# Patient Record
Sex: Male | Born: 1965
Health system: Southern US, Community
[De-identification: ages and names within clinical notes are randomized; demographics above are authoritative.]

## PROBLEM LIST (undated history)

## (undated) DIAGNOSIS — D333 Benign neoplasm of cranial nerves: Secondary | ICD-10-CM

## (undated) DIAGNOSIS — T7840XA Allergy, unspecified, initial encounter: Secondary | ICD-10-CM

## (undated) DIAGNOSIS — Z9889 Other specified postprocedural states: Secondary | ICD-10-CM

## (undated) DIAGNOSIS — I10 Essential (primary) hypertension: Secondary | ICD-10-CM

## (undated) DIAGNOSIS — H9190 Unspecified hearing loss, unspecified ear: Secondary | ICD-10-CM

## (undated) DIAGNOSIS — R112 Nausea with vomiting, unspecified: Secondary | ICD-10-CM

## (undated) DIAGNOSIS — E785 Hyperlipidemia, unspecified: Secondary | ICD-10-CM

## (undated) HISTORY — DX: Hyperlipidemia, unspecified: E78.5

## (undated) HISTORY — DX: Allergy, unspecified, initial encounter: T78.40XA

## (undated) HISTORY — DX: Benign neoplasm of cranial nerves: D33.3

## (undated) HISTORY — PX: COLONOSCOPY: SHX174

## (undated) HISTORY — PX: WISDOM TOOTH EXTRACTION: SHX21

## (undated) HISTORY — DX: Essential (primary) hypertension: I10

## (undated) HISTORY — PX: KNEE SURGERY: SHX244

---

## 2006-08-24 ENCOUNTER — Encounter (INDEPENDENT_AMBULATORY_CARE_PROVIDER_SITE_OTHER): Payer: Self-pay | Admitting: Family Medicine

## 2006-08-24 ENCOUNTER — Ambulatory Visit: Payer: Self-pay | Admitting: Family Medicine

## 2006-08-24 ENCOUNTER — Encounter: Payer: Self-pay | Admitting: Family Medicine

## 2006-08-24 DIAGNOSIS — E785 Hyperlipidemia, unspecified: Secondary | ICD-10-CM | POA: Insufficient documentation

## 2006-08-24 DIAGNOSIS — I10 Essential (primary) hypertension: Secondary | ICD-10-CM | POA: Insufficient documentation

## 2006-08-24 LAB — CONVERTED CEMR LAB
AST: 22 units/L (ref 0–37)
BUN: 7 mg/dL (ref 6–23)
CO2: 32 meq/L (ref 19–32)
Calcium: 9.4 mg/dL (ref 8.4–10.5)
GFR calc Af Amer: 137 mL/min
GFR calc non Af Amer: 113 mL/min
LDL Cholesterol: 72 mg/dL (ref 0–99)
Potassium: 4.4 meq/L (ref 3.5–5.1)
Total CHOL/HDL Ratio: 3.2
Triglycerides: 116 mg/dL (ref 0–149)

## 2006-09-07 ENCOUNTER — Ambulatory Visit: Payer: Self-pay | Admitting: Family Medicine

## 2006-09-07 LAB — CONVERTED CEMR LAB
CO2: 34 meq/L — ABNORMAL HIGH (ref 19–32)
Creatinine, Ser: 1 mg/dL (ref 0.4–1.5)
GFR calc Af Amer: 106 mL/min
Potassium: 3.4 meq/L — ABNORMAL LOW (ref 3.5–5.1)
Sodium: 143 meq/L (ref 135–145)

## 2006-09-20 ENCOUNTER — Ambulatory Visit: Payer: Self-pay | Admitting: Family Medicine

## 2006-09-20 LAB — CONVERTED CEMR LAB: Potassium: 3.3 meq/L — ABNORMAL LOW (ref 3.5–5.1)

## 2006-09-26 ENCOUNTER — Ambulatory Visit: Payer: Self-pay | Admitting: Family Medicine

## 2006-10-12 ENCOUNTER — Ambulatory Visit: Payer: Self-pay | Admitting: Family Medicine

## 2006-10-17 ENCOUNTER — Telehealth (INDEPENDENT_AMBULATORY_CARE_PROVIDER_SITE_OTHER): Payer: Self-pay | Admitting: *Deleted

## 2008-03-25 ENCOUNTER — Ambulatory Visit: Payer: Self-pay | Admitting: Family Medicine

## 2008-03-25 DIAGNOSIS — D179 Benign lipomatous neoplasm, unspecified: Secondary | ICD-10-CM | POA: Insufficient documentation

## 2008-03-28 ENCOUNTER — Encounter (INDEPENDENT_AMBULATORY_CARE_PROVIDER_SITE_OTHER): Payer: Self-pay | Admitting: *Deleted

## 2008-03-28 LAB — CONVERTED CEMR LAB
ALT: 33 units/L (ref 0–53)
AST: 27 units/L (ref 0–37)
Albumin: 4.3 g/dL (ref 3.5–5.2)
Alkaline Phosphatase: 76 units/L (ref 39–117)
BUN: 15 mg/dL (ref 6–23)
Bilirubin, Direct: 0.1 mg/dL (ref 0.0–0.3)
CO2: 31 meq/L (ref 19–32)
Calcium: 9.3 mg/dL (ref 8.4–10.5)
Chloride: 105 meq/L (ref 96–112)
Creatinine, Ser: 1.1 mg/dL (ref 0.4–1.5)
GFR calc Af Amer: 94 mL/min
GFR calc non Af Amer: 78 mL/min
Glucose, Bld: 100 mg/dL — ABNORMAL HIGH (ref 70–99)
Potassium: 3.8 meq/L (ref 3.5–5.1)
Sodium: 142 meq/L (ref 135–145)
Total Bilirubin: 0.8 mg/dL (ref 0.3–1.2)
Total Protein: 7.4 g/dL (ref 6.0–8.3)

## 2008-04-18 ENCOUNTER — Ambulatory Visit: Payer: Self-pay | Admitting: Family Medicine

## 2008-04-22 ENCOUNTER — Encounter (INDEPENDENT_AMBULATORY_CARE_PROVIDER_SITE_OTHER): Payer: Self-pay | Admitting: *Deleted

## 2008-04-22 LAB — CONVERTED CEMR LAB
BUN: 13 mg/dL (ref 6–23)
CO2: 29 meq/L (ref 19–32)
Chloride: 103 meq/L (ref 96–112)
Cholesterol: 223 mg/dL (ref 0–200)
Creatinine, Ser: 1 mg/dL (ref 0.4–1.5)
Direct LDL: 140.3 mg/dL
Glucose, Bld: 88 mg/dL (ref 70–99)

## 2008-06-13 ENCOUNTER — Ambulatory Visit: Payer: Self-pay | Admitting: Family Medicine

## 2008-06-18 ENCOUNTER — Encounter (INDEPENDENT_AMBULATORY_CARE_PROVIDER_SITE_OTHER): Payer: Self-pay | Admitting: *Deleted

## 2008-06-18 LAB — CONVERTED CEMR LAB
BUN: 16 mg/dL (ref 6–23)
CO2: 30 meq/L (ref 19–32)
Chloride: 105 meq/L (ref 96–112)
Creatinine, Ser: 1 mg/dL (ref 0.4–1.5)
Glucose, Bld: 96 mg/dL (ref 70–99)

## 2008-07-18 ENCOUNTER — Telehealth (INDEPENDENT_AMBULATORY_CARE_PROVIDER_SITE_OTHER): Payer: Self-pay | Admitting: *Deleted

## 2008-08-07 ENCOUNTER — Ambulatory Visit: Payer: Self-pay | Admitting: Family Medicine

## 2008-08-07 DIAGNOSIS — L218 Other seborrheic dermatitis: Secondary | ICD-10-CM | POA: Insufficient documentation

## 2009-05-22 ENCOUNTER — Ambulatory Visit: Payer: Self-pay | Admitting: Family

## 2009-05-22 ENCOUNTER — Telehealth (INDEPENDENT_AMBULATORY_CARE_PROVIDER_SITE_OTHER): Payer: Self-pay | Admitting: *Deleted

## 2009-05-22 LAB — CONVERTED CEMR LAB
ALT: 40 units/L (ref 0–53)
AST: 27 units/L (ref 0–37)
Alkaline Phosphatase: 57 units/L (ref 39–117)
Basophils Absolute: 0 10*3/uL (ref 0.0–0.1)
Bilirubin, Direct: 0.1 mg/dL (ref 0.0–0.3)
CO2: 30 meq/L (ref 19–32)
Calcium: 9.8 mg/dL (ref 8.4–10.5)
Eosinophils Absolute: 0.2 10*3/uL (ref 0.0–0.7)
Glucose, Bld: 94 mg/dL (ref 70–99)
HDL: 39.8 mg/dL (ref >39.00)
Hemoglobin: 15.4 g/dL (ref 13.0–17.0)
Lymphocytes Relative: 34.7 % (ref 12.0–46.0)
Lymphs Abs: 2.2 10*3/uL (ref 0.7–4.0)
MCHC: 33.3 g/dL (ref 30.0–36.0)
Neutro Abs: 3.4 10*3/uL (ref 1.4–7.7)
RDW: 12 % (ref 11.5–14.6)
Sodium: 138 meq/L (ref 135–145)
Total Protein: 7.7 g/dL (ref 6.0–8.3)

## 2009-06-03 ENCOUNTER — Ambulatory Visit: Payer: Self-pay | Admitting: Family

## 2009-06-20 ENCOUNTER — Telehealth (INDEPENDENT_AMBULATORY_CARE_PROVIDER_SITE_OTHER): Payer: Self-pay | Admitting: *Deleted

## 2009-08-26 ENCOUNTER — Ambulatory Visit: Payer: Self-pay | Admitting: Family

## 2009-08-26 LAB — CONVERTED CEMR LAB
HDL: 44 mg/dL (ref >39.00)
LDL Cholesterol: 128 mg/dL — ABNORMAL HIGH (ref 0–99)
Total CHOL/HDL Ratio: 5
VLDL: 25.6 mg/dL (ref 0.0–40.0)

## 2009-08-27 ENCOUNTER — Encounter: Payer: Self-pay | Admitting: Family

## 2009-10-03 ENCOUNTER — Telehealth (INDEPENDENT_AMBULATORY_CARE_PROVIDER_SITE_OTHER): Payer: Self-pay | Admitting: *Deleted

## 2009-10-07 ENCOUNTER — Telehealth (INDEPENDENT_AMBULATORY_CARE_PROVIDER_SITE_OTHER): Payer: Self-pay | Admitting: *Deleted

## 2009-11-06 ENCOUNTER — Telehealth (INDEPENDENT_AMBULATORY_CARE_PROVIDER_SITE_OTHER): Payer: Self-pay | Admitting: *Deleted

## 2009-11-10 ENCOUNTER — Telehealth (INDEPENDENT_AMBULATORY_CARE_PROVIDER_SITE_OTHER): Payer: Self-pay | Admitting: *Deleted

## 2009-12-15 ENCOUNTER — Telehealth (INDEPENDENT_AMBULATORY_CARE_PROVIDER_SITE_OTHER): Payer: Self-pay | Admitting: *Deleted

## 2010-01-22 ENCOUNTER — Telehealth (INDEPENDENT_AMBULATORY_CARE_PROVIDER_SITE_OTHER): Payer: Self-pay | Admitting: *Deleted

## 2010-02-17 ENCOUNTER — Ambulatory Visit: Payer: Self-pay | Admitting: Family Medicine

## 2010-02-18 LAB — CONVERTED CEMR LAB
ALT: 30 units/L (ref 0–53)
AST: 26 units/L (ref 0–37)
Calcium: 9.7 mg/dL (ref 8.4–10.5)
Direct LDL: 158.1 mg/dL
GFR calc non Af Amer: 95.94 mL/min (ref >60)
HDL: 39.3 mg/dL (ref >39.00)
Sodium: 137 meq/L (ref 135–145)
Total Protein: 7 g/dL (ref 6.0–8.3)
Triglycerides: 109 mg/dL (ref 0.0–149.0)

## 2010-04-16 ENCOUNTER — Ambulatory Visit: Payer: Self-pay | Admitting: Family Medicine

## 2010-04-17 LAB — CONVERTED CEMR LAB
ALT: 38 units/L (ref 0–53)
Albumin: 4.6 g/dL (ref 3.5–5.2)
Total Bilirubin: 0.8 mg/dL (ref 0.3–1.2)

## 2010-05-14 ENCOUNTER — Telehealth (INDEPENDENT_AMBULATORY_CARE_PROVIDER_SITE_OTHER): Payer: Self-pay | Admitting: *Deleted

## 2010-06-30 NOTE — Assessment & Plan Note (Signed)
Summary: FOLLOWUP VISIT, WILL BE FASTING --OK TO USE SLOT PER CHEMIRA/...   Vital Signs:  Patient profile:   45 year old male Weight:      228 pounds (103.64 kg) Temp:     97.1 degrees F oral BP sitting:   144 / 100  (left arm) Cuff size:   large  Vitals Entered By: Lucious Groves CMA (February 17, 2010 8:07 AM) CC: F/U and med refill/fasting./kb Is Patient Diabetic? No Pain Assessment Patient in pain? no        History of Present Illness: 45 yo man here today for BP.  BP not well controlled today.  not salting foods.  denies increased stress.  exercising regularly- walking.  no CP, SOB, HAs, visual changes, edema, N/V.  + family hx.  hyperlipidemia- controlling levels w/ diet and exercise.  due for labs today.  Problems Prior to Update: 1)  Preventive Health Care  (ICD-V70.0) 2)  Dandruff  (ICD-690.18) 3)  Lipoma  (ICD-214.9) 4)  Family History of Colon Ca 1st Degree Relative <60  (ICD-V16.0) 5)  Hyperlipidemia  (ICD-272.4) 6)  Hypertension  (ICD-401.9)  Current Medications (verified): 1)  Lisinopril-Hydrochlorothiazide 20-12.5 Mg Tabs (Lisinopril-Hydrochlorothiazide) .Marland Kitchen.. 1 Tablet By Mouth Daily  Allergies (verified): No Known Drug Allergies  Past History:  Past Medical History: Last updated: 08/24/2006 Hypertension Hyperlipidemia  Social History: Last updated: 05/22/2009 Occupation:Electronics Married Never Smoked Alcohol use-yes several drinks a month Drug use-no  Review of Systems      See HPI  Physical Exam  General:  Well-developed,well-nourished,in no acute distress; alert,appropriate and cooperative throughout examination Head:  Normocephalic and atraumatic without obvious abnormalities. No apparent alopecia or balding. Neck:  No deformities, masses, or tenderness noted. Lungs:  Normal respiratory effort, chest expands symmetrically. Lungs are clear to auscultation, no crackles or wheezes. Heart:  Normal rate and regular rhythm. S1 and S2 normal  without gallop, murmur, click, rub or other extra sounds. Abdomen:  Bowel sounds positive,abdomen soft and non-tender without masses, organomegaly or hernias noted. Pulses:  +2 carotid, radial, DP Extremities:  No clubbing, cyanosis, edema, or deformity noted   Impression & Recommendations:  Problem # 1:  HYPERTENSION (ICD-401.9) Assessment Deteriorated BP not at goal today.  pt was not aware that processed meats- bacon, sausage, ham- are high in salt.  reviewed components of low salt diet.  asymptomatic.  rather than adjust meds today will have pt return in 2-3 weeks to trend.  if still high will need to add/adjust meds.  Pt expresses understanding and is in agreement w/ this plan. His updated medication list for this problem includes:    Lisinopril-hydrochlorothiazide 20-12.5 Mg Tabs (Lisinopril-hydrochlorothiazide) .Marland Kitchen... 1 tablet by mouth daily  Orders: Venipuncture ([address]) TLB-BMP (Basic Metabolic Panel-BMET) (80048-METABOL) Specimen Handling ([address])  Problem # 2:  HYPERLIPIDEMIA (ICD-272.4) Assessment: Unchanged applauded pt's efforts on diet and exercise.  will check labs. Orders: TLB-Lipid Panel (80061-LIPID) TLB-Hepatic/Liver Function Pnl (80076-HEPATIC) Specimen Handling ([address])  Complete Medication List: 1)  Lisinopril-hydrochlorothiazide 20-12.5 Mg Tabs (Lisinopril-hydrochlorothiazide) .Marland Kitchen.. 1 tablet by mouth daily  Patient Instructions: 1)  Schedule a nurse visit in 2-3 weeks to recheck blood pressure 2)  We'll notify you of your lab results 3)  Keep up the good work on diet and exercise 4)  Call with any questions or concerns 5)  Have a great fall season!! Prescriptions: LISINOPRIL-HYDROCHLOROTHIAZIDE 20-12.5 MG TABS (LISINOPRIL-HYDROCHLOROTHIAZIDE) 1 tablet by mouth daily  #30 x 6   Entered and Authorized by:   Neena Rhymes MD  Signed by:   Neena Rhymes MD on 02/17/2010   Method used:   Electronically to        [facility] (retail)        [address]       [place], [place]  [address]       Ph: [phone]       Fax: [phone]   RxID:   [id number]

## 2010-06-30 NOTE — Letter (Signed)
   [facility] [address] [place], [place] [address] [phone]    June 03, 2009   [place] [address] [place], [place] [address]  RE:  LAB RESULTS  Dear  [name],  The following is an interpretation of your most recent lab tests.  Please take note of any instructions provided or changes to medications that have resulted from your lab work.  B12 and Folate are normal   Sincerely Yours,    Lemont Fillers FNP

## 2010-06-30 NOTE — Progress Notes (Signed)
Summary: lisinopril/hctz refill   Phone Note Refill Request Message from:  Fax from Pharmacy on December 15, 2009 10:24 AM  Refills Requested: Medication #1:  LISINOPRIL-HYDROCHLOROTHIAZIDE 20-12.5 MG TABS 1 tablet by [facility] daily. [facility] [phone] Jani Files [phone]  Initial call taken by: Okey Regal Spring,  December 15, 2009 10:24 AM    Prescriptions: LISINOPRIL-HYDROCHLOROTHIAZIDE 20-12.5 MG TABS (LISINOPRIL-HYDROCHLOROTHIAZIDE) 1 tablet by mouth daily  #30 Tablet x 0   Entered by:   Doristine Devoid CMA   Authorized by:   Neena Rhymes MD   Signed by:   Doristine Devoid CMA on 12/15/2009   Method used:   Electronically to        [facility] (retail)       [address]       [place], [place]  [address]       Ph: [phone]       Fax: [phone]   RxID:   [id number]

## 2010-06-30 NOTE — Progress Notes (Signed)
Summary: lisinopril/hctz refill   Phone Note Refill Request Call back at [phone] Message from:  Pharmacy on January 22, 2010 3:34 PM  Refills Requested: Medication #1:  LISINOPRIL-HYDROCHLOROTHIAZIDE 20-12.5 MG TABS 1 tablet by mouth daily.   Dosage confirmed as above?Dosage Confirmed   Supply Requested: 1 month   Last Refilled: 12/15/2009 [facility] [address] [facility]  Next Appointment Scheduled: NONE Initial call taken by: Lavell Islam,  January 22, 2010 3:34 PM    Prescriptions: LISINOPRIL-HYDROCHLOROTHIAZIDE 20-12.5 MG TABS (LISINOPRIL-HYDROCHLOROTHIAZIDE) 1 tablet by mouth daily  #30 Tablet x 1   Entered by:   Doristine Devoid CMA   Authorized by:   Neena Rhymes MD   Signed by:   Doristine Devoid CMA on 01/22/2010   Method used:   Electronically to        [facility] [address] (retail)       [address]       [place], [place]  [address]       Ph: [phone]       Fax: [phone]   RxID:   [id number]

## 2010-06-30 NOTE — Progress Notes (Signed)
Summary: lisinopri/hctz  Phone Note Refill Request Message from:  Patient on November 10, 2009 8:16 AM  Refills Requested: Medication #1:  LISINOPRIL-HYDROCHLOROTHIAZIDE 20-12.5 MG TABS 1 tablet by mouth daily.   Dosage confirmed as above?Dosage Confirmed   Supply Requested: 3 months [facility] [address]!!! Not [facility]!!!!! Patient would also like to have a 3 month supply as well!!  Next Appointment Scheduled: none Initial call taken by: Harold Barban,  November 10, 2009 8:16 AM    Prescriptions: LISINOPRIL-HYDROCHLOROTHIAZIDE 20-12.5 MG TABS (LISINOPRIL-HYDROCHLOROTHIAZIDE) 1 tablet by mouth daily  #30 Tablet x 0   Entered by:   Doristine Devoid   Authorized by:   Neena Rhymes MD   Signed by:   Doristine Devoid on 11/10/2009   Method used:   Electronically to        [facility] (retail)       [address]       [place], [place]  [address]       Ph: [phone]       Fax: [phone]   RxID:   [id number]

## 2010-06-30 NOTE — Progress Notes (Signed)
Summary: RX to wrong pharmacy  Phone Note Call from Patient Call back at Home Phone [phone]   Reason for Call: Refill Medication Summary of Call: RX for Lisinopril was sent to wrong pharmacy. [facility] needs to [facility] to [facility] [address]. If this could be done today please, the patient is now out of the medication. Thanks! Initial call taken by: Harold Barban,  Oct 07, 2009 10:42 AM  Follow-up for Phone Call        LEFT MSG  RX REFAXED TO [facility] .Kandice Hams  Oct 07, 2009 11:36 AM  Follow-up by: Kandice Hams,  Oct 07, 2009 11:37 AM    Prescriptions: LISINOPRIL-HYDROCHLOROTHIAZIDE 20-12.5 MG TABS (LISINOPRIL-HYDROCHLOROTHIAZIDE) 1 tablet by mouth daily  #30 Tablet x 0   Entered by:   Kandice Hams   Authorized by:   Neena Rhymes MD   Signed by:   Kandice Hams on 10/07/2009   Method used:   Re-Faxed to ...       [facility] (retail)       [address]       [place], [place]  [address]       Ph: [phone]       Fax: [phone]   RxID:   [id number]

## 2010-06-30 NOTE — Letter (Signed)
   [facility] [address] [place], [place] [address] [phone]   August 27, 2009   [place] [address] [place], [place] [address]  RE:  LAB RESULTS  Dear  Gabriel Oconnell,  The following is an interpretation of your most recent lab tests.  Please take note of any instructions provided or changes to medications that have resulted from your lab work.  LIPID PANEL:  Good - no changes needed Triglyceride: 128.0   Cholesterol: 198   LDL: 128   HDL: 44.00   Chol/HDL%:  5 Your cholesterol has improved. Keep up the good work!   Sincerely Yours,    Lemont Fillers FNP

## 2010-06-30 NOTE — Progress Notes (Signed)
Summary: lisinopril/hctz refill   Phone Note Refill Request Call back at [phone] Message from:  Pharmacy on November 06, 2009 10:38 AM  Refills Requested: Medication #1:  LISINOPRIL-HYDROCHLOROTHIAZIDE 20-12.5 MG TABS 1 tablet by mouth daily.   Dosage confirmed as above?Dosage Confirmed   Supply Requested: 1 month   [facility] [address]  Next Appointment Scheduled: none Initial call taken by: Harold Barban,  November 06, 2009 10:38 AM    Prescriptions: LISINOPRIL-HYDROCHLOROTHIAZIDE 20-12.5 MG TABS (LISINOPRIL-HYDROCHLOROTHIAZIDE) 1 tablet by mouth daily  #30 Tablet x 0   Entered by:   Doristine Devoid   Authorized by:   Neena Rhymes MD   Signed by:   Doristine Devoid on 11/06/2009   Method used:   Electronically to        [facility] [address] (retail)       [phone]       [place], [place]  [address]       Ph: [phone]       Fax: [phone]   RxID:   [id number]

## 2010-06-30 NOTE — Assessment & Plan Note (Signed)
Summary: followup/alr   Vital Signs:  Patient profile:   46 year old male Weight:      227 pounds Pulse rate:   70 / minute BP sitting:   136 / 80  (left arm)  Vitals Entered By: Doristine Devoid (June 03, 2009 8:15 AM) CC: f/u on labs    CC:  f/u on labs .  History of Present Illness: [name] is a 45 year old male who presents today to follow up of his cholesterol.  MCV was incidentally noted to be high last visit.  Notes that he has been eating two eggs every morning as well as cheese.    Allergies: No Known Drug Allergies  Physical Exam  General:  Well-developed,well-nourished,in no acute distress; alert,appropriate and cooperative throughout examination Head:  Normocephalic and atraumatic without obvious abnormalities. No apparent alopecia or balding. Psych:  Oriented X3, memory intact for recent and remote, normally interactive, and good eye contact.     Impression & Recommendations:  Problem # 1:  HYPERLIPIDEMIA (ICD-272.4) Assessment Deteriorated  Patient resistant to meds- wants to work on diet.  Patient counselled on low cholesterol diet- he is already exercising regularly.  Plan to bring patient back in 3 months for a FLP.  Will also check B12/folate while here today due to macrocytosis noted on CBC  Labs Reviewed: SGOT: 27 (05/22/2009)   SGPT: 40 (05/22/2009)   HDL:39.80 (05/22/2009), 35.7 (04/18/2008)  LDL:DEL (04/18/2008), 72 (60/45/4098)  Chol:230 (05/22/2009), 223 (04/18/2008)  Trig:102.0 (05/22/2009), 121 (04/18/2008)  Complete Medication List: 1)  Lisinopril-hydrochlorothiazide 20-12.5 Mg Tabs (Lisinopril-hydrochlorothiazide) .Marland Kitchen.. 1 tablet by mouth daily  Other Orders: Venipuncture ([address]) TLB-B12, Serum-Total ONLY (78[address]-A21) TLB-Folic Acid (Folate) (82746-FOL)  Patient Instructions: 1)  Please schedule a follow-up appointment in 3 months. 2)  Please return for a FASTING Lipid Profile one (1) week before your next appointment as scheduled.  (272.4) 3)  Follow a low cholesterol diet.

## 2010-06-30 NOTE — Progress Notes (Signed)
Summary: refill  Phone Note Refill Request Message from:  F[facility] from [facility] [address] fax [phone]  Refills Requested: Medication #1:  LISINOPRIL-HYDROCHLOROTHIAZIDE 20-12.5 MG TABS 1 tablet by mouth daily. Initial call taken by: Barb Merino,  June 20, 2009 10:04 AM    Prescriptions: LISINOPRIL-HYDROCHLOROTHIAZIDE 20-12.5 MG TABS (LISINOPRIL-HYDROCHLOROTHIAZIDE) 1 tablet by mouth daily  #30 Tablet x 2   Entered by:   Shary Decamp   Authorized by:   Neena Rhymes MD   Signed by:   Shary Decamp on 06/20/2009   Method used:   Electronically to        [facility] [address] [address] (retail)       [address]       [place], [place]  [address]       Ph: [phone]       Fax: [phone]   RxID:   [id number]

## 2010-06-30 NOTE — Progress Notes (Signed)
Summary: Refill Request  Phone Note Refill Request Call back at [phone] Message from:  Pharmacy on Oct 03, 2009 9:18 AM  Refills Requested: Medication #1:  LISINOPRIL-HYDROCHLOROTHIAZIDE 20-12.5 MG TABS 1 tablet by mouth daily.   Dosage confirmed as above?Dosage Confirmed   Supply Requested: 1 month   Last Refilled: [facility]/2009 [facility] [address]  Next Appointment Scheduled: none Initial call taken by: Harold Barban,  Oct 03, 2009 9:18 AM    Prescriptions: LISINOPRIL-HYDROCHLOROTHIAZIDE 20-12.5 MG TABS (LISINOPRIL-HYDROCHLOROTHIAZIDE) 1 tablet by mouth daily  #30 Tablet x 0   Entered by:   Kandice Hams   Authorized by:   Neena Rhymes MD   Signed by:   Kandice Hams on 10/03/2009   Method used:   Faxed to ...       [facility] [address] (retail)       [phone]       [place], [place]  [address]       Ph: [phone]       Fax: [phone]   RxID:   [id number]

## 2010-07-02 NOTE — Progress Notes (Signed)
Summary: simvastatin refill   Phone Note Refill Request Message from:  Fax from Pharmacy on May 14, 2010 8:22 AM  Refills Requested: Medication #1:  SIMVASTATIN 20 MG TABS Take 1 tab at bedtime. [facility] - fax [phone]  Initial call taken by: Okey Regal Spring,  May 14, 2010 8:23 AM    Prescriptions: SIMVASTATIN 20 MG TABS (SIMVASTATIN) Take 1 tab at bedtime  #90 x 1   Entered by:   Doristine Devoid CMA   Authorized by:   Neena Rhymes MD   Signed by:   Doristine Devoid CMA on 05/14/2010   Method used:   Faxed to ...       [facility]       [address] [address]       [place], [place]  [address]       Ph: [phone]       Fax: [phone]   RxID:   [id number]

## 2010-09-22 ENCOUNTER — Encounter: Payer: Self-pay | Admitting: Family Medicine

## 2010-09-29 ENCOUNTER — Ambulatory Visit (INDEPENDENT_AMBULATORY_CARE_PROVIDER_SITE_OTHER): Payer: BC Managed Care – PPO | Admitting: Family Medicine

## 2010-09-29 ENCOUNTER — Encounter: Payer: Self-pay | Admitting: Family Medicine

## 2010-09-29 DIAGNOSIS — E785 Hyperlipidemia, unspecified: Secondary | ICD-10-CM

## 2010-09-29 DIAGNOSIS — Z800 Family history of malignant neoplasm of digestive organs: Secondary | ICD-10-CM | POA: Insufficient documentation

## 2010-09-29 DIAGNOSIS — I10 Essential (primary) hypertension: Secondary | ICD-10-CM

## 2010-09-29 LAB — BASIC METABOLIC PANEL
GFR: 93.3 mL/min (ref >60.00)
Glucose, Bld: 93 mg/dL (ref 70–99)
Potassium: 4.6 mEq/L (ref 3.5–5.1)
Sodium: 142 mEq/L (ref 135–145)

## 2010-09-29 LAB — LIPID PANEL
HDL: 38.9 mg/dL — ABNORMAL LOW (ref >39.00)
LDL Cholesterol: 103 mg/dL — ABNORMAL HIGH (ref 0–99)
Total CHOL/HDL Ratio: 4
VLDL: 18.4 mg/dL (ref 0.0–40.0)

## 2010-09-29 LAB — HEPATIC FUNCTION PANEL
AST: 29 U/L (ref 0–37)
Albumin: 4.4 g/dL (ref 3.5–5.2)
Alkaline Phosphatase: 53 U/L (ref 39–117)
Bilirubin, Direct: 0.1 mg/dL (ref 0.0–0.3)

## 2010-09-29 MED ORDER — SIMVASTATIN 20 MG PO TABS: 20.0000 mg | ORAL_TABLET | Freq: Every day | ORAL | Status: DC

## 2010-09-29 NOTE — Assessment & Plan Note (Signed)
Will refer for colonoscopy

## 2010-09-29 NOTE — Assessment & Plan Note (Signed)
Systolic BP is excellent but diastolic BP remains elevated.  Will continue to follow closely.  Pt continues to work on diet and exercise.  Applauded his efforts.

## 2010-09-29 NOTE — Assessment & Plan Note (Signed)
Due for labs today.  Adjust meds prn.

## 2010-09-29 NOTE — Progress Notes (Signed)
  Subjective:    Patient ID: Joshau Code, male    DOB: 1966-01-05, 45 y.o.   MRN: [id number]  HPI HTN- systolic BP is excellent, diastolic BP is elevated.  Taking meds regularly.  Has started exercising regularly.  No CP, SOB, HAs, visual changes, edema.  Took BP at home and it was 140/100.  Hyperlipidemia-  Chronic problem for pt, taking meds regularly.  Denies abd pain, N/V, myalgias.  Family hx of colon cancer- grandmother died of colon cancer, father has had pre-cancerous polyps removed.   Review of Systems For ROS see HPI     Objective:   Physical Exam  Constitutional: He is oriented to person, place, and time. He appears well-developed and well-nourished. No distress.  HENT:  Head: Normocephalic and atraumatic.  Eyes: Conjunctivae and EOM are normal. Pupils are equal, round, and reactive to light.  Neck: Normal range of motion. Neck supple. No thyromegaly present.  Cardiovascular: Normal rate, regular rhythm, normal heart sounds and intact distal pulses.   No murmur heard. Pulmonary/Chest: Effort normal and breath sounds normal. No respiratory distress.  Abdominal: Soft. Bowel sounds are normal. He exhibits no distension.  Musculoskeletal: He exhibits no edema.  Lymphadenopathy:    He has no cervical adenopathy.  Neurological: He is alert and oriented to person, place, and time. No cranial nerve deficit.  Skin: Skin is warm and dry.  Psychiatric: He has a normal mood and affect. His behavior is normal.          Assessment & Plan:

## 2010-09-29 NOTE — Patient Instructions (Signed)
Follow up in 3 months to recheck your blood pressure Continue to take your medicines daily Keep up the good work on diet and exercise!  I'm so proud of you!! We'll call you with your GI appt Call with any questions or concerns Happy Spring!!

## 2010-09-30 ENCOUNTER — Encounter: Payer: Self-pay | Admitting: *Deleted

## 2010-10-13 ENCOUNTER — Ambulatory Visit (AMBULATORY_SURGERY_CENTER): Payer: BC Managed Care – PPO

## 2010-10-13 ENCOUNTER — Telehealth: Payer: Self-pay | Admitting: Gastroenterology

## 2010-10-13 VITALS — Ht 72.0 in | Wt 234.6 lb

## 2010-10-13 DIAGNOSIS — Z800 Family history of malignant neoplasm of digestive organs: Secondary | ICD-10-CM

## 2010-10-13 MED ORDER — PEG-KCL-NACL-NASULF-NA ASC-C 100 G PO SOLR: 1.0000 | Freq: Once | ORAL | Status: AC

## 2010-10-13 NOTE — Telephone Encounter (Signed)
I spoke with pt and he forgot to tell me his father had colon cancer and colon surgery when he was in the office today for the pre-visit. I will document it in his chart.

## 2010-10-19 ENCOUNTER — Other Ambulatory Visit: Payer: Self-pay | Admitting: Family Medicine

## 2010-10-27 ENCOUNTER — Other Ambulatory Visit: Payer: BC Managed Care – PPO | Admitting: Gastroenterology

## 2010-11-02 ENCOUNTER — Telehealth: Payer: Self-pay | Admitting: *Deleted

## 2010-11-02 ENCOUNTER — Telehealth: Payer: Self-pay | Admitting: Gastroenterology

## 2010-11-02 NOTE — Telephone Encounter (Signed)
Pt has concerns about getting sick after procedure.  Had knee surgery in 1996 and remembers having nausea and vomiting; he does not want to have this happen when he has colonoscopy.  Explained difference between general anesthesia and moderate sedation.  Assured pt that we would monitor him closely and take care of any nausea and vomiting should it occur. Gabriel Oconnell

## 2010-11-06 ENCOUNTER — Encounter: Payer: Self-pay | Admitting: Gastroenterology

## 2010-11-06 ENCOUNTER — Ambulatory Visit (AMBULATORY_SURGERY_CENTER): Payer: BC Managed Care – PPO | Admitting: Gastroenterology

## 2010-11-06 VITALS — BP 164/105 | HR 76 | Temp 96.8°F | Resp 23 | Ht 72.0 in | Wt 234.0 lb

## 2010-11-06 DIAGNOSIS — Z800 Family history of malignant neoplasm of digestive organs: Secondary | ICD-10-CM

## 2010-11-06 DIAGNOSIS — Z1211 Encounter for screening for malignant neoplasm of colon: Secondary | ICD-10-CM

## 2010-11-06 MED ORDER — SODIUM CHLORIDE 0.9 % IV SOLN: 500.0000 mL | INTRAVENOUS | Status: DC

## 2010-11-06 NOTE — Patient Instructions (Signed)
FINDINGS: NORMAL  REPEAT EXAM IN 5 YEARS  YOU MAY RESUME YOUR MEDICATIONS AS OU WOULD NORMALLY TAKE THEM

## 2010-11-09 ENCOUNTER — Telehealth: Payer: Self-pay | Admitting: *Deleted

## 2010-11-09 NOTE — Telephone Encounter (Signed)

## 2011-02-25 ENCOUNTER — Other Ambulatory Visit: Payer: Self-pay | Admitting: Family Medicine

## 2011-03-17 ENCOUNTER — Ambulatory Visit (INDEPENDENT_AMBULATORY_CARE_PROVIDER_SITE_OTHER): Payer: BC Managed Care – PPO | Admitting: Family Medicine

## 2011-03-17 ENCOUNTER — Encounter: Payer: Self-pay | Admitting: Family Medicine

## 2011-03-17 DIAGNOSIS — E785 Hyperlipidemia, unspecified: Secondary | ICD-10-CM

## 2011-03-17 DIAGNOSIS — I10 Essential (primary) hypertension: Secondary | ICD-10-CM

## 2011-03-17 MED ORDER — LISINOPRIL-HYDROCHLOROTHIAZIDE 20-12.5 MG PO TABS: 1.0000 | ORAL_TABLET | Freq: Every day | ORAL | Status: DC

## 2011-03-17 MED ORDER — SIMVASTATIN 20 MG PO TABS: 20.0000 mg | ORAL_TABLET | Freq: Every day | ORAL | Status: DC

## 2011-03-17 NOTE — Progress Notes (Signed)
  Subjective:    Patient ID: Gabriel Oconnell, male    DOB: [birth year], 45 y.o.   MRN: [id number]  HPI HTN- BP better control today.  On Lisinopril HCT.  No CP, SOB, HAs, visual changes, edema.  Hyperlipidemia- chronic problem, well controlled on Zocor.  No abd pain, N/V, myalgias.   Review of Systems For ROS see HPI     Objective:   Physical Exam  Vitals reviewed. Constitutional: He is oriented to person, place, and time. He appears well-developed and well-nourished. No distress.  HENT:  Head: Normocephalic and atraumatic.  Eyes: Conjunctivae and EOM are normal. Pupils are equal, round, and reactive to light.  Neck: Normal range of motion. Neck supple. No thyromegaly present.  Cardiovascular: Normal rate, regular rhythm, normal heart sounds and intact distal pulses.   No murmur heard. Pulmonary/Chest: Effort normal and breath sounds normal. No respiratory distress.  Abdominal: Soft. Bowel sounds are normal. He exhibits no distension.  Musculoskeletal: He exhibits no edema.  Lymphadenopathy:    He has no cervical adenopathy.  Neurological: He is alert and oriented to person, place, and time. No cranial nerve deficit.  Skin: Skin is warm and dry.  Psychiatric: He has a normal mood and affect. His behavior is normal.          Assessment & Plan:

## 2011-03-17 NOTE — Patient Instructions (Signed)
Schedule your complete physical in 6 months- do not eat before this You look great!  Keep up the good work!! Call with any questions or concerns Happy Holidays!

## 2011-03-18 LAB — BASIC METABOLIC PANEL
CO2: 28 mEq/L (ref 19–32)
Calcium: 9.7 mg/dL (ref 8.4–10.5)
Creatinine, Ser: 1 mg/dL (ref 0.4–1.5)

## 2011-03-18 LAB — LIPID PANEL
Cholesterol: 182 mg/dL (ref 0–200)
Total CHOL/HDL Ratio: 4
Triglycerides: 146 mg/dL (ref 0.0–149.0)

## 2011-03-18 LAB — HEPATIC FUNCTION PANEL
AST: 32 U/L (ref 0–37)
Albumin: 4.6 g/dL (ref 3.5–5.2)
Alkaline Phosphatase: 61 U/L (ref 39–117)

## 2011-03-18 NOTE — Telephone Encounter (Signed)
See other phone note

## 2011-03-21 NOTE — Assessment & Plan Note (Signed)
Typically well controlled.  Check labs.  Adjust meds prn.  No difficulties tolerating statin.

## 2011-03-21 NOTE — Assessment & Plan Note (Signed)
Much better control today.  Asymptomatic.  No med changes.

## 2012-01-06 ENCOUNTER — Other Ambulatory Visit: Payer: Self-pay | Admitting: *Deleted

## 2012-01-06 MED ORDER — LISINOPRIL-HYDROCHLOROTHIAZIDE 20-12.5 MG PO TABS: 1.0000 | ORAL_TABLET | Freq: Every day | ORAL | Status: DC

## 2012-01-06 NOTE — Progress Notes (Signed)
Pt states that pharmacy advise that Rx had no refills. Advise Pt that both lisinopril and simvastatin should have had the same # of refills and the 6 month f/u due. Pt request 90 day refill on med and will call to schedule f/u. Rx sent.

## 2012-07-07 ENCOUNTER — Ambulatory Visit (INDEPENDENT_AMBULATORY_CARE_PROVIDER_SITE_OTHER): Payer: BC Managed Care – PPO | Admitting: Family Medicine

## 2012-07-07 ENCOUNTER — Encounter: Payer: Self-pay | Admitting: Family Medicine

## 2012-07-07 VITALS — BP 140/100 | HR 69 | Temp 98.0°F | Ht 73.5 in | Wt 242.2 lb

## 2012-07-07 DIAGNOSIS — Z0000 Encounter for general adult medical examination without abnormal findings: Secondary | ICD-10-CM

## 2012-07-07 DIAGNOSIS — E785 Hyperlipidemia, unspecified: Secondary | ICD-10-CM

## 2012-07-07 DIAGNOSIS — I10 Essential (primary) hypertension: Secondary | ICD-10-CM

## 2012-07-07 DIAGNOSIS — M25559 Pain in unspecified hip: Secondary | ICD-10-CM

## 2012-07-07 LAB — CBC WITH DIFFERENTIAL/PLATELET
Basophils Absolute: 0 10*3/uL (ref 0.0–0.1)
Eosinophils Absolute: 0.1 10*3/uL (ref 0.0–0.7)
HCT: 42.6 % (ref 39.0–52.0)
Lymphs Abs: 2.3 10*3/uL (ref 0.7–4.0)
MCHC: 34.4 g/dL (ref 30.0–36.0)
Monocytes Absolute: 0.5 10*3/uL (ref 0.1–1.0)
Monocytes Relative: 7.4 % (ref 3.0–12.0)
Platelets: 222 10*3/uL (ref 150.0–400.0)
RDW: 12.1 % (ref 11.5–14.6)

## 2012-07-07 LAB — HEPATIC FUNCTION PANEL
Alkaline Phosphatase: 59 U/L (ref 39–117)
Bilirubin, Direct: 0.1 mg/dL (ref 0.0–0.3)
Total Bilirubin: 0.7 mg/dL (ref 0.3–1.2)

## 2012-07-07 LAB — LIPID PANEL
Cholesterol: 153 mg/dL (ref 0–200)
HDL: 40.8 mg/dL (ref >39.00)
LDL Cholesterol: 90 mg/dL (ref 0–99)
Total CHOL/HDL Ratio: 4
Triglycerides: 111 mg/dL (ref 0.0–149.0)
VLDL: 22.2 mg/dL (ref 0.0–40.0)

## 2012-07-07 LAB — BASIC METABOLIC PANEL
CO2: 26 mEq/L (ref 19–32)
Calcium: 9.4 mg/dL (ref 8.4–10.5)
GFR: 93.74 mL/min (ref >60.00)
Sodium: 138 mEq/L (ref 135–145)

## 2012-07-07 MED ORDER — SIMVASTATIN 20 MG PO TABS: 20.0000 mg | ORAL_TABLET | Freq: Every day | ORAL | Status: DC

## 2012-07-07 MED ORDER — LISINOPRIL-HYDROCHLOROTHIAZIDE 20-12.5 MG PO TABS: 1.0000 | ORAL_TABLET | Freq: Every day | ORAL | Status: DC

## 2012-07-07 MED ORDER — MELOXICAM 15 MG PO TABS: 15.0000 mg | ORAL_TABLET | Freq: Every day | ORAL | Status: DC

## 2012-07-07 NOTE — Progress Notes (Signed)
  Subjective:    Patient ID: Gabriel Oconnell, male    DOB: 11-04-1965, 47 y.o.   MRN: [id number]  HPI CPE- UTD on colonoscopy.  L hip pain- + family hx of degenerative hip dz, sxs 1st started 'several months ago'.  Initially thought it was a pulled muscle.  Pain is worse w/ activity- walking.  Ok to ride bike.  Has not tried OTC NSAIDs.  No radiation of pain.  Pain localized to L groin.   Review of Systems Patient reports no vision/hearing changes, anorexia, fever ,adenopathy, persistant/recurrent hoarseness, swallowing issues, chest pain, palpitations, edema, persistant/recurrent cough, hemoptysis, dyspnea (rest,exertional, paroxysmal nocturnal), gastrointestinal  bleeding (melena, rectal bleeding), abdominal pain, excessive heart burn, GU symptoms (dysuria, hematuria, voiding/incontinence issues) syncope, focal weakness, memory loss, numbness & tingling, skin/hair/nail changes, depression, anxiety, abnormal bruising/bleeding.     Objective:   Physical Exam BP 130/100  Pulse 69  Temp 98 F (36.7 C) (Oral)  Ht 6' 1.5" (1.867 m)  Wt 242 lb 3.2 oz (109.861 kg)  BMI 31.52 kg/m2  SpO2 95%  General Appearance:    Alert, cooperative, no distress, appears stated age  Head:    Normocephalic, without obvious abnormality, atraumatic  Eyes:    PERRL, conjunctiva/corneas clear, EOM's intact, fundi    benign, both eyes       Ears:    Normal TM's and external ear canals, both ears  Nose:   Nares normal, septum midline, mucosa normal, no drainage   or sinus tenderness  Throat:   Lips, mucosa, and tongue normal; teeth and gums normal  Neck:   Supple, symmetrical, trachea midline, no adenopathy;       thyroid:  No enlargement/tenderness/nodules  Back:     Symmetric, no curvature, ROM normal, no CVA tenderness  Lungs:     Clear to auscultation bilaterally, respirations unlabored  Chest wall:    No tenderness or deformity  Heart:    Regular rate and rhythm, S1 and S2 normal, no murmur, rub   or gallop   Abdomen:     Soft, non-tender, bowel sounds active all four quadrants,    no masses, no organomegaly  Genitalia:    Normal male without lesion, discharge or tenderness  Rectal:    Normal tone, normal prostate, no masses or tenderness  Extremities:   Extremities normal, atraumatic, no cyanosis or edema  Pulses:   2+ and symmetric all extremities  Skin:   Skin color, texture, turgor normal, no rashes or lesions  Lymph nodes:   Cervical, supraclavicular, and axillary nodes normal  Neurologic:   CNII-XII intact. Normal strength, sensation and reflexes      throughout          Assessment & Plan:

## 2012-07-07 NOTE — Assessment & Plan Note (Signed)
Chronic problem.  Elevated today.  Pt possibly anxious for rectal exam.  Will recheck in 1 month and adjust meds at that time if still elevated.  Pt expressed understanding and is in agreement w/ plan.

## 2012-07-07 NOTE — Assessment & Plan Note (Signed)
New.  Pt w/ family hx of degenerative hip disease.  Now having pain w/ activity daily.  Start daily NSAID.  Refer to ortho.  Pt expressed understanding and is in agreement w/ plan.

## 2012-07-07 NOTE — Patient Instructions (Addendum)
Follow up in 1 month to recheck BP We'll notify you of your lab results and make any changes if needed Try and make healthy food choices and get regular exercise Start the Mobic 1 tab daily- w/ food- for the hip pain Call with any questions or concerns Happy Early Birthday!!!

## 2012-07-07 NOTE — Assessment & Plan Note (Signed)
Pt's PE WNL.  UTD on colonoscopy.  EKG done- see document for interpretation.  Check labs.  Anticipatory guidance provided.  

## 2012-07-07 NOTE — Assessment & Plan Note (Signed)
Chronic problem.  Check labs.  Adjust meds prn  

## 2012-09-11 ENCOUNTER — Encounter: Payer: Self-pay | Admitting: Family Medicine

## 2012-09-11 ENCOUNTER — Ambulatory Visit (INDEPENDENT_AMBULATORY_CARE_PROVIDER_SITE_OTHER): Payer: BC Managed Care – PPO | Admitting: Family Medicine

## 2012-09-11 VITALS — BP 150/110 | HR 67 | Temp 98.1°F | Ht 73.5 in | Wt 227.4 lb

## 2012-09-11 DIAGNOSIS — I10 Essential (primary) hypertension: Secondary | ICD-10-CM

## 2012-09-11 MED ORDER — LISINOPRIL-HYDROCHLOROTHIAZIDE 20-12.5 MG PO TABS: 2.0000 | ORAL_TABLET | Freq: Every day | ORAL | Status: DC

## 2012-09-11 NOTE — Patient Instructions (Addendum)
Follow up in 1 month to recheck BP Increase your dose to 2 tabs daily in the AM Call with any questions or concerns Happy Spring!!!

## 2012-09-11 NOTE — Progress Notes (Signed)
  Subjective:    Patient ID: Gabriel Oconnell, male    DOB: 08/09/1965, 47 y.o.   MRN: [id number]  HPI HTN- chronic problem, on Lisinopril HCT daily.  Has lost 15 lbs since last visit.  BP is again elevated today.  1 week ago developed ringing in the R ear- went to UC and was given Flonase and injxn w/out relief.  BPs will be labile- 127/83 is lowest.  BP will elevate throughout the day based on activity.  Strong family hx of HTN.  No CP, SOB, HAs, visual changes.     Review of Systems For ROS see HPI     Objective:   Physical Exam  Vitals reviewed. Constitutional: He is oriented to person, place, and time. He appears well-developed and well-nourished. No distress.  HENT:  Head: Normocephalic and atraumatic.  Eyes: Conjunctivae and EOM are normal. Pupils are equal, round, and reactive to light.  Neck: Normal range of motion. Neck supple. No thyromegaly present.  Cardiovascular: Normal rate, regular rhythm, normal heart sounds and intact distal pulses.   No murmur heard. Pulmonary/Chest: Effort normal and breath sounds normal. No respiratory distress.  Abdominal: Soft. Bowel sounds are normal. He exhibits no distension.  Musculoskeletal: He exhibits no edema.  Lymphadenopathy:    He has no cervical adenopathy.  Neurological: He is alert and oriented to person, place, and time. No cranial nerve deficit.  Skin: Skin is warm and dry.  Psychiatric: He has a normal mood and affect. His behavior is normal.          Assessment & Plan:

## 2012-09-11 NOTE — Assessment & Plan Note (Signed)
Deteriorated.  BP is elevated higher than previous.  Discussed 2 options w/ pt- maxing out current BP regimen or adding amlodipine.  Pt opted for increasing Lisinopril HCT to 2 tabs daily.  F/u in 1 month to recheck BP and BMP.  Reviewed supportive care and red flags that should prompt return.  Pt expressed understanding and is in agreement w/ plan.

## 2012-10-12 ENCOUNTER — Encounter: Payer: Self-pay | Admitting: Family Medicine

## 2012-10-12 ENCOUNTER — Ambulatory Visit (INDEPENDENT_AMBULATORY_CARE_PROVIDER_SITE_OTHER): Payer: BC Managed Care – PPO | Admitting: Family Medicine

## 2012-10-12 ENCOUNTER — Encounter: Payer: Self-pay | Admitting: *Deleted

## 2012-10-12 VITALS — BP 140/100 | HR 75 | Wt 228.0 lb

## 2012-10-12 DIAGNOSIS — I10 Essential (primary) hypertension: Secondary | ICD-10-CM

## 2012-10-12 DIAGNOSIS — H9319 Tinnitus, unspecified ear: Secondary | ICD-10-CM

## 2012-10-12 DIAGNOSIS — H9311 Tinnitus, right ear: Secondary | ICD-10-CM

## 2012-10-12 LAB — BASIC METABOLIC PANEL
Chloride: 101 mEq/L (ref 96–112)
Potassium: 3.6 mEq/L (ref 3.5–5.1)

## 2012-10-12 MED ORDER — VALSARTAN-HYDROCHLOROTHIAZIDE 320-12.5 MG PO TABS: 1.0000 | ORAL_TABLET | Freq: Every day | ORAL | Status: DC

## 2012-10-12 NOTE — Patient Instructions (Addendum)
Follow up in 6 weeks to recheck BP Stop the Lisinopril START the Diovan daily We'll call you audiology appt We'll notify you of your lab results and make any changes if needed Keep up the good work! Happy Memorial Day!!!

## 2012-10-12 NOTE — Progress Notes (Signed)
  Subjective:    Patient ID: Gabriel Oconnell, male    DOB: 1965-10-14, 47 y.o.   MRN: [id number]  HPI HTN- chronic problem, on Lisinopril HCTZ 2 tabs daily.  Very consistent in taking meds.  Continues to have intermittent ringing in R ear- started lipoflavinoid for this.  Has never seen audiology.  No dizziness.  Denies CP, SOB, HAs, visual changes, edema.     Review of Systems For ROS see HPI     Objective:   Physical Exam  Vitals reviewed. Constitutional: He is oriented to person, place, and time. He appears well-developed and well-nourished. No distress.  HENT:  Head: Normocephalic and atraumatic.  Right Ear: External ear normal.  Left Ear: External ear normal.  Nose: Nose normal.  Mouth/Throat: Oropharynx is clear and moist. No oropharyngeal exudate.  Eyes: Conjunctivae and EOM are normal. Pupils are equal, round, and reactive to light.  Neck: Normal range of motion. Neck supple. No thyromegaly present.  Cardiovascular: Normal rate, regular rhythm, normal heart sounds and intact distal pulses.   No murmur heard. Pulmonary/Chest: Effort normal and breath sounds normal. No respiratory distress.  Abdominal: Soft. Bowel sounds are normal. He exhibits no distension.  Musculoskeletal: He exhibits no edema.  Lymphadenopathy:    He has no cervical adenopathy.  Neurological: He is alert and oriented to person, place, and time. No cranial nerve deficit.  Skin: Skin is warm and dry.  Psychiatric: He has a normal mood and affect. His behavior is normal.          Assessment & Plan:

## 2012-10-12 NOTE — Assessment & Plan Note (Signed)
Unchanged.  Pt is not achieving control w/ Lisinopril.  Switch to Diovan HCTZ daily.  Check labs to assess K+ and Cr due to increased med dose at last visit.  Reviewed supportive care and red flags that should prompt return.  Pt expressed understanding and is in agreement w/ plan.

## 2012-10-12 NOTE — Assessment & Plan Note (Signed)
New.  No abnormality on PE.  Refer to audiology for complete evaluation.  Pt expressed understanding and is in agreement w/ plan.

## 2012-10-20 ENCOUNTER — Other Ambulatory Visit: Payer: Self-pay | Admitting: Otolaryngology

## 2012-10-20 DIAGNOSIS — H9319 Tinnitus, unspecified ear: Secondary | ICD-10-CM

## 2012-10-20 DIAGNOSIS — Z139 Encounter for screening, unspecified: Secondary | ICD-10-CM

## 2012-10-20 DIAGNOSIS — H903 Sensorineural hearing loss, bilateral: Secondary | ICD-10-CM

## 2012-10-20 DIAGNOSIS — H905 Unspecified sensorineural hearing loss: Secondary | ICD-10-CM

## 2012-10-28 ENCOUNTER — Other Ambulatory Visit: Payer: BC Managed Care – PPO

## 2012-10-31 ENCOUNTER — Inpatient Hospital Stay: Admission: RE | Admit: 2012-10-31 | Payer: BC Managed Care – PPO | Source: Ambulatory Visit

## 2012-10-31 ENCOUNTER — Other Ambulatory Visit: Payer: BC Managed Care – PPO

## 2012-11-06 ENCOUNTER — Ambulatory Visit
Admission: RE | Admit: 2012-11-06 | Discharge: 2012-11-06 | Disposition: A | Discharge status: Home / Self Care | Payer: BC Managed Care – PPO | Source: Ambulatory Visit | Attending: Otolaryngology | Admitting: Otolaryngology

## 2012-11-06 DIAGNOSIS — Z139 Encounter for screening, unspecified: Secondary | ICD-10-CM

## 2012-11-06 DIAGNOSIS — H905 Unspecified sensorineural hearing loss: Secondary | ICD-10-CM

## 2012-11-06 DIAGNOSIS — H903 Sensorineural hearing loss, bilateral: Secondary | ICD-10-CM

## 2012-11-06 DIAGNOSIS — H9319 Tinnitus, unspecified ear: Secondary | ICD-10-CM

## 2012-11-06 MED ORDER — GADOBENATE DIMEGLUMINE 529 MG/ML IV SOLN
20.0000 mL | Freq: Once | INTRAVENOUS | Status: AC | PRN
  Administered 2012-11-06: 20 mL via INTRAVENOUS

## 2012-11-24 ENCOUNTER — Telehealth: Payer: Self-pay | Admitting: Family Medicine

## 2012-11-24 NOTE — Telephone Encounter (Signed)
FYI to MD.      KP 

## 2012-11-24 NOTE — Telephone Encounter (Signed)
Patient states that he had an MRI a few weeks ago after his appointment with [facility] ENT where Dr. Beverely Low referred him. The imaging revealed that he has an acoustic neuroma that is 15mm x 7.63mm. Patient wants to assure that Dr. Beverely Low was aware of this before he come in for his next appointment 12/06/12.

## 2012-12-06 ENCOUNTER — Ambulatory Visit (INDEPENDENT_AMBULATORY_CARE_PROVIDER_SITE_OTHER): Payer: BC Managed Care – PPO | Admitting: Family Medicine

## 2012-12-06 ENCOUNTER — Encounter: Payer: Self-pay | Admitting: Family Medicine

## 2012-12-06 VITALS — BP 140/88 | HR 74 | Temp 98.2°F | Ht 73.5 in | Wt 230.0 lb

## 2012-12-06 DIAGNOSIS — D333 Benign neoplasm of cranial nerves: Secondary | ICD-10-CM

## 2012-12-06 DIAGNOSIS — I10 Essential (primary) hypertension: Secondary | ICD-10-CM

## 2012-12-06 MED ORDER — AMLODIPINE BESYLATE 5 MG PO TABS: 5.0000 mg | ORAL_TABLET | Freq: Every day | ORAL | Status: DC

## 2012-12-06 NOTE — Patient Instructions (Addendum)
Follow up in 1 month to recheck BP Add the Amlodipine daily Continue the Valsartan-HCTZ daily ASK THE SURGEON  1) How many have you done?  2) What are the risks, how commonly do they occur?  3) Which would you want your family to have?  4) Does the small size make this more or less likely to have complications?  5) Recovery time for each?  6) Risk of radiation exposure?  7) Possibility of recurrence? If I think of anything else, I'll let you know! Hang in there!!!

## 2012-12-06 NOTE — Progress Notes (Signed)
  Subjective:    Patient ID: Gabriel Oconnell, male    DOB: 09/18/1965, 47 y.o.   MRN: [id number]  HPI Vestibular schwannoma- dx'd on MRI in May be ENT.  Has appts coming up w/ 2 separate surgeons to discuss his options (surgery vs gamma knife).  Pt was unhappy w/ the way his dx was relayed and the lack of f/u that occurred afterwards on the part of ENT.  Felt he had questions that were not answered and had to do a lot of research on his own regarding the dx.  Pt wants to know if there is anything he should ask at his upcoming appt.  HTN- chronic problem.  Slightly above goal today but improved since switching to Diovan HCT.  Denies CP, SOB, HAs, visual changes, edema.  + stress (see above)   Review of Systems For ROS see HPI     Objective:   Physical Exam  Vitals reviewed. Constitutional: He is oriented to person, place, and time. He appears well-developed and well-nourished. No distress.  HENT:  Head: Normocephalic and atraumatic.  Eyes: Conjunctivae and EOM are normal. Pupils are equal, round, and reactive to light.  Neck: Normal range of motion. Neck supple. No thyromegaly present.  Cardiovascular: Normal rate, regular rhythm, normal heart sounds and intact distal pulses.   No murmur heard. Pulmonary/Chest: Effort normal and breath sounds normal. No respiratory distress.  Abdominal: Soft. Bowel sounds are normal. He exhibits no distension.  Musculoskeletal: He exhibits no edema.  Lymphadenopathy:    He has no cervical adenopathy.  Neurological: He is alert and oriented to person, place, and time. No cranial nerve deficit.  Skin: Skin is warm and dry.  Psychiatric: He has a normal mood and affect. His behavior is normal.          Assessment & Plan:

## 2012-12-07 LAB — BASIC METABOLIC PANEL
CO2: 30 mEq/L (ref 19–32)
Chloride: 102 mEq/L (ref 96–112)
GFR: 82.14 mL/min (ref >60.00)
Glucose, Bld: 108 mg/dL — ABNORMAL HIGH (ref 70–99)
Potassium: 3.7 mEq/L (ref 3.5–5.1)
Sodium: 138 mEq/L (ref 135–145)

## 2012-12-07 NOTE — Assessment & Plan Note (Signed)
New.  Reviewed MRI w/ pt, discussed dx and reviewed possible questions for each surgeon.  Will continue to follow along and provide support as able.

## 2012-12-07 NOTE — Assessment & Plan Note (Signed)
Chronic problem.  Improved since switching to Diovan.  Pt wants even tighter BP control w/ the possibility of surgery.  Will add low dose Norvasc.  Told him I was actually surprised how good BP was considering all he's been dealing with.  Will continue to follow.

## 2012-12-08 ENCOUNTER — Encounter: Payer: Self-pay | Admitting: *Deleted

## 2012-12-28 ENCOUNTER — Telehealth: Payer: Self-pay | Admitting: Family Medicine

## 2012-12-28 NOTE — Telephone Encounter (Signed)
Patient left vm on triage line stating he would like to start taking 81mg  aspirin daily. He would like to know if Dr. Beverely Low approves of this. CB# [phone]

## 2012-12-29 NOTE — Telephone Encounter (Signed)
Spoke with patient and gave information.

## 2012-12-29 NOTE — Telephone Encounter (Signed)
Patient would like to start taking 81 mg of aspirin daily is this ok. Please Advise.

## 2012-12-29 NOTE — Telephone Encounter (Signed)
Yes

## 2013-01-17 ENCOUNTER — Ambulatory Visit (INDEPENDENT_AMBULATORY_CARE_PROVIDER_SITE_OTHER): Payer: BC Managed Care – PPO | Admitting: Family Medicine

## 2013-01-17 ENCOUNTER — Encounter: Payer: Self-pay | Admitting: Family Medicine

## 2013-01-17 VITALS — BP 140/96 | HR 66 | Temp 98.1°F | Ht 73.5 in | Wt 230.6 lb

## 2013-01-17 DIAGNOSIS — I10 Essential (primary) hypertension: Secondary | ICD-10-CM

## 2013-01-17 MED ORDER — AMLODIPINE BESYLATE 10 MG PO TABS: 10.0000 mg | ORAL_TABLET | Freq: Every day | ORAL | Status: DC

## 2013-01-17 NOTE — Assessment & Plan Note (Signed)
Chronic problem.  Not well controlled despite adding Amlodipine.  Increase to 10mg  daily

## 2013-01-17 NOTE — Patient Instructions (Addendum)
Follow up in 1 month to recheck BP Increase the Amlodipine to 10 mg- 2 of what you have at home and 1 of the new script Drink plenty of water Call with any questions or concerns Happy Labor Day!

## 2013-01-17 NOTE — Progress Notes (Signed)
  Subjective:    Patient ID: Elius Etheredge, male    DOB: May 01, 1966, 47 y.o.   MRN: [id number]  HPI HTN- chronic problem.  Added Amlodipine at last visit.  On Diovan HCT.  Denies CP, SOB, HAs, visual changes, edema.   Review of Systems For ROS see HPI     Objective:   Physical Exam  Vitals reviewed. Constitutional: He is oriented to person, place, and time. He appears well-developed and well-nourished. No distress.  HENT:  Head: Normocephalic and atraumatic.  Eyes: Conjunctivae and EOM are normal. Pupils are equal, round, and reactive to light.  Neck: Normal range of motion. Neck supple. No thyromegaly present.  Cardiovascular: Normal rate, regular rhythm, normal heart sounds and intact distal pulses.   No murmur heard. Pulmonary/Chest: Effort normal and breath sounds normal. No respiratory distress.  Abdominal: Soft. Bowel sounds are normal. He exhibits no distension.  Musculoskeletal: He exhibits no edema.  Lymphadenopathy:    He has no cervical adenopathy.  Neurological: He is alert and oriented to person, place, and time. No cranial nerve deficit.  Skin: Skin is warm and dry.  Psychiatric: He has a normal mood and affect. His behavior is normal.          Assessment & Plan:

## 2013-02-22 ENCOUNTER — Encounter: Payer: Self-pay | Admitting: Family Medicine

## 2013-02-22 ENCOUNTER — Ambulatory Visit (INDEPENDENT_AMBULATORY_CARE_PROVIDER_SITE_OTHER): Payer: BC Managed Care – PPO | Admitting: Family Medicine

## 2013-02-22 VITALS — BP 136/84 | HR 82 | Temp 98.5°F | Wt 232.6 lb

## 2013-02-22 DIAGNOSIS — D333 Benign neoplasm of cranial nerves: Secondary | ICD-10-CM

## 2013-02-22 DIAGNOSIS — I10 Essential (primary) hypertension: Secondary | ICD-10-CM

## 2013-02-22 NOTE — Progress Notes (Signed)
  Subjective:    Patient ID: Gabriel Oconnell, male    DOB: 12-11-1965, 47 y.o.   MRN: [id number]  HPI HTN- BP better controlled today since increasing Amlodipine to 10mg  daily.  Denies CP, SOB, HAs, visual changes, edema.  Vestibular Schwannoma- pt has upcoming appt w/ surgeon for 2nd opinion on surgery vs gamma knife radiation.  Pt is interested in preserving hearing at all costs.   Review of Systems For ROS see HPI     Objective:   Physical Exam  Vitals reviewed. Constitutional: He is oriented to person, place, and time. He appears well-developed and well-nourished. No distress.  HENT:  Head: Normocephalic and atraumatic.  Eyes: Conjunctivae and EOM are normal. Pupils are equal, round, and reactive to light.  Neck: Normal range of motion. Neck supple. No thyromegaly present.  Cardiovascular: Normal rate, regular rhythm, normal heart sounds and intact distal pulses.   No murmur heard. Pulmonary/Chest: Effort normal and breath sounds normal. No respiratory distress.  Abdominal: Soft. Bowel sounds are normal. He exhibits no distension.  Musculoskeletal: He exhibits no edema.  Lymphadenopathy:    He has no cervical adenopathy.  Neurological: He is alert and oriented to person, place, and time. No cranial nerve deficit.  Skin: Skin is warm and dry.  Psychiatric: He has a normal mood and affect. His behavior is normal.          Assessment & Plan:

## 2013-02-22 NOTE — Patient Instructions (Addendum)
Schedule your complete physical for February BP looks great!  We're there! Let me know about the ear Hang in there!

## 2013-02-23 NOTE — Assessment & Plan Note (Signed)
Improved.  BP at goal.  Asymptomatic.  No med changes.

## 2013-02-23 NOTE — Assessment & Plan Note (Signed)
Pt has 2nd opinion upcoming to discuss surgery vs gamma knife radiation.  Pt interested in preserving hearing at all costs.  Will follow and provide emotional support during this process.

## 2013-03-01 ENCOUNTER — Other Ambulatory Visit: Payer: Self-pay | Admitting: *Deleted

## 2013-03-01 MED ORDER — AMLODIPINE BESYLATE 10 MG PO TABS: 10.0000 mg | ORAL_TABLET | Freq: Every day | ORAL | Status: DC

## 2013-03-02 ENCOUNTER — Other Ambulatory Visit: Payer: Self-pay | Admitting: Family Medicine

## 2013-03-02 NOTE — Telephone Encounter (Signed)
Med filled.  

## 2013-05-12 ENCOUNTER — Other Ambulatory Visit: Payer: Self-pay | Admitting: Family Medicine

## 2013-05-14 NOTE — Telephone Encounter (Signed)
Med filled.  

## 2013-07-02 ENCOUNTER — Other Ambulatory Visit: Payer: Self-pay | Admitting: Family Medicine

## 2013-07-02 NOTE — Telephone Encounter (Signed)
Pt due for OV.

## 2013-07-20 ENCOUNTER — Telehealth: Payer: Self-pay

## 2013-07-20 NOTE — Telephone Encounter (Signed)
Medication List and allergies:  Reviewed and updated  90 day supply/mail order: [facility] Local prescriptions: [facility] [address]  Immunizations due: declines flu vaccine; unsure when last Tdap was  A/P:   Updated FH, PSH & Personal Hx Flu vaccine--declines Tdap-->10 years; when he was in the [employer] PSA--07/2012--1.10  To Discuss with Provider: Refill amlodipine  Left nare bleeds easily in winter months/uses Mir or Vaseline which doesn't help much/Suggestions please? And, if you could find a cure for the acoustic neuroma he would be ever so grateful.

## 2013-07-23 ENCOUNTER — Other Ambulatory Visit: Payer: Self-pay | Admitting: Family Medicine

## 2013-07-23 ENCOUNTER — Ambulatory Visit (INDEPENDENT_AMBULATORY_CARE_PROVIDER_SITE_OTHER): Payer: BC Managed Care – PPO | Admitting: Family Medicine

## 2013-07-23 ENCOUNTER — Encounter: Payer: Self-pay | Admitting: Family Medicine

## 2013-07-23 VITALS — BP 122/82 | HR 93 | Temp 98.1°F | Resp 16 | Ht 73.25 in | Wt 235.5 lb

## 2013-07-23 DIAGNOSIS — D72829 Elevated white blood cell count, unspecified: Secondary | ICD-10-CM

## 2013-07-23 DIAGNOSIS — Z23 Encounter for immunization: Secondary | ICD-10-CM

## 2013-07-23 DIAGNOSIS — Z0000 Encounter for general adult medical examination without abnormal findings: Secondary | ICD-10-CM

## 2013-07-23 LAB — CBC WITH DIFFERENTIAL/PLATELET
BASOS ABS: 0 10*3/uL (ref 0.0–0.1)
BASOS PCT: 0.3 % (ref 0.0–3.0)
EOS ABS: 0 10*3/uL (ref 0.0–0.7)
Eosinophils Relative: 0.4 % (ref 0.0–5.0)
HEMATOCRIT: 44 % (ref 39.0–52.0)
HEMOGLOBIN: 14.9 g/dL (ref 13.0–17.0)
LYMPHS ABS: 2.6 10*3/uL (ref 0.7–4.0)
Lymphocytes Relative: 24.6 % (ref 12.0–46.0)
MCHC: 33.8 g/dL (ref 30.0–36.0)
MCV: 97.5 fl (ref 78.0–100.0)
MONOS PCT: 5.9 % (ref 3.0–12.0)
Monocytes Absolute: 0.6 10*3/uL (ref 0.1–1.0)
NEUTROS ABS: 7.4 10*3/uL (ref 1.4–7.7)
Neutrophils Relative %: 68.8 % (ref 43.0–77.0)
Platelets: 278 10*3/uL (ref 150.0–400.0)
RBC: 4.51 Mil/uL (ref 4.22–5.81)
RDW: 12.8 % (ref 11.5–14.6)
WBC: 10.7 10*3/uL — ABNORMAL HIGH (ref 4.5–10.5)

## 2013-07-23 LAB — HEPATIC FUNCTION PANEL
ALK PHOS: 64 U/L (ref 39–117)
ALT: 29 U/L (ref 0–53)
AST: 23 U/L (ref 0–37)
Albumin: 4.4 g/dL (ref 3.5–5.2)
BILIRUBIN DIRECT: 0.1 mg/dL (ref 0.0–0.3)
Total Bilirubin: 0.9 mg/dL (ref 0.3–1.2)
Total Protein: 7.2 g/dL (ref 6.0–8.3)

## 2013-07-23 LAB — BASIC METABOLIC PANEL
BUN: 17 mg/dL (ref 6–23)
CALCIUM: 9.3 mg/dL (ref 8.4–10.5)
CO2: 28 meq/L (ref 19–32)
CREATININE: 1 mg/dL (ref 0.4–1.5)
Chloride: 104 mEq/L (ref 96–112)
GFR: 85.75 mL/min (ref >60.00)
GLUCOSE: 102 mg/dL — AB (ref 70–99)
Potassium: 3.8 mEq/L (ref 3.5–5.1)
Sodium: 140 mEq/L (ref 135–145)

## 2013-07-23 LAB — LIPID PANEL
CHOL/HDL RATIO: 3
Cholesterol: 142 mg/dL (ref 0–200)
HDL: 44.6 mg/dL (ref >39.00)
LDL Cholesterol: 75 mg/dL (ref 0–99)
Triglycerides: 112 mg/dL (ref 0.0–149.0)
VLDL: 22.4 mg/dL (ref 0.0–40.0)

## 2013-07-23 LAB — TSH: TSH: 0.52 u[IU]/mL (ref 0.35–5.50)

## 2013-07-23 LAB — PSA: PSA: 1.26 ng/mL (ref 0.10–4.00)

## 2013-07-23 MED ORDER — AMLODIPINE BESYLATE 10 MG PO TABS: 10.0000 mg | ORAL_TABLET | Freq: Every day | ORAL | Status: DC

## 2013-07-23 NOTE — Progress Notes (Signed)
   Subjective:    Patient ID: Gabriel Oconnell, male    DOB: 1966-01-25, 48 y.o.   MRN: [id number]  HPI CPE- UTD on colonoscopy.  Nose bleeds- occurs when heat is on, using saline solution and vasoline.  Switched to [employer].  Only occurs in the winter.  Gas heat has built in humidifier.   Review of Systems Patient reports no vision/hearing changes, anorexia, fever ,adenopathy, persistant/recurrent hoarseness, swallowing issues, chest pain, palpitations, edema, persistant/recurrent cough, hemoptysis, dyspnea (rest,exertional, paroxysmal nocturnal), gastrointestinal  bleeding (melena, rectal bleeding), abdominal pain, excessive heart burn, GU symptoms (dysuria, hematuria, voiding/incontinence issues) syncope, focal weakness, memory loss, numbness & tingling, skin/hair/nail changes, depression, anxiety, abnormal bruising/bleeding, musculoskeletal symptoms/signs.     Objective:   Physical Exam BP 122/82  Pulse 93  Temp(Src) 98.1 F (36.7 C) (Oral)  Resp 16  Ht 6' 1.25" (1.861 m)  Wt 235 lb 8 oz (106.822 kg)  BMI 30.84 kg/m2  SpO2 97%  General Appearance:    Alert, cooperative, no distress, appears stated age  Head:    Normocephalic, without obvious abnormality, atraumatic  Eyes:    PERRL, conjunctiva/corneas clear, EOM's intact, fundi    benign, both eyes       Ears:    Normal TM's and external ear canals, both ears  Nose:   Nares normal, septum midline, mucosa normal, no drainage   or sinus tenderness  Throat:   Lips, mucosa, and tongue normal; teeth and gums normal  Neck:   Supple, symmetrical, trachea midline, no adenopathy;       thyroid:  No enlargement/tenderness/nodules; no carotid   bruit or JVD  Back:     Symmetric, no curvature, ROM normal, no CVA tenderness  Lungs:     Clear to auscultation bilaterally, respirations unlabored  Chest wall:    No tenderness or deformity  Heart:    Regular rate and rhythm, S1 and S2 normal, no murmur, rub   or gallop  Abdomen:     Soft,  non-tender, bowel sounds active all four quadrants,    no masses, no organomegaly  Genitalia:    Normal male without lesion, discharge or tenderness  Rectal:    Normal tone, normal prostate, no masses or tenderness;   guaiac negative stool  Extremities:   Extremities normal, atraumatic, no cyanosis or edema  Pulses:   2+ and symmetric all extremities  Skin:   Skin color, texture, turgor normal, no rashes or lesions  Lymph nodes:   Cervical, supraclavicular, and axillary nodes normal  Neurologic:   CNII-XII intact. Normal strength, sensation and reflexes      throughout          Assessment & Plan:

## 2013-07-23 NOTE — Assessment & Plan Note (Signed)
Pt's PE WNL.  UTD on colonoscopy.  Check labs.  Anticipatory guidance provided.

## 2013-07-23 NOTE — Progress Notes (Signed)
Pre visit review using our clinic review tool, if applicable. No additional management support is needed unless otherwise documented below in the visit note. 

## 2013-07-23 NOTE — Patient Instructions (Signed)
Follow up in 6 months to recheck BP and cholesterol We'll notify you of your lab results and make any changes if needed Keep up the good work! Use a bedside humidifier nightly to improve nose bleeds Call with any questions or concerns Happy [employer] Birthday!

## 2013-07-23 NOTE — Addendum Note (Signed)
Addended by: Kris Hartmann on: 07/23/2013 08:52 AM   Modules accepted: Orders

## 2013-07-23 NOTE — Addendum Note (Signed)
Addended by: Kris Hartmann on: 07/23/2013 08:53 AM   Modules accepted: Orders

## 2013-09-10 ENCOUNTER — Other Ambulatory Visit: Payer: BC Managed Care – PPO

## 2013-09-11 ENCOUNTER — Other Ambulatory Visit: Payer: Self-pay | Admitting: Family Medicine

## 2013-09-11 NOTE — Telephone Encounter (Signed)
Med filled.  

## 2013-09-18 ENCOUNTER — Other Ambulatory Visit (INDEPENDENT_AMBULATORY_CARE_PROVIDER_SITE_OTHER): Payer: BC Managed Care – PPO

## 2013-09-18 ENCOUNTER — Other Ambulatory Visit: Payer: Self-pay | Admitting: Otolaryngology

## 2013-09-18 DIAGNOSIS — D72829 Elevated white blood cell count, unspecified: Secondary | ICD-10-CM

## 2013-09-18 DIAGNOSIS — D333 Benign neoplasm of cranial nerves: Secondary | ICD-10-CM

## 2013-09-19 ENCOUNTER — Encounter: Payer: Self-pay | Admitting: *Deleted

## 2013-09-19 LAB — CBC WITH DIFFERENTIAL/PLATELET
Basophils Absolute: 0 10*3/uL (ref 0.0–0.1)
Basophils Relative: 0.4 % (ref 0.0–3.0)
Eosinophils Absolute: 0.2 10*3/uL (ref 0.0–0.7)
Eosinophils Relative: 2.9 % (ref 0.0–5.0)
HCT: 40.7 % (ref 39.0–52.0)
HEMOGLOBIN: 13.9 g/dL (ref 13.0–17.0)
Lymphocytes Relative: 35.7 % (ref 12.0–46.0)
Lymphs Abs: 2.6 10*3/uL (ref 0.7–4.0)
MCHC: 34.2 g/dL (ref 30.0–36.0)
MCV: 96.8 fl (ref 78.0–100.0)
MONO ABS: 0.6 10*3/uL (ref 0.1–1.0)
Monocytes Relative: 8.7 % (ref 3.0–12.0)
Neutro Abs: 3.9 10*3/uL (ref 1.4–7.7)
Neutrophils Relative %: 52.3 % (ref 43.0–77.0)
Platelets: 252 10*3/uL (ref 150.0–400.0)
RBC: 4.21 Mil/uL — AB (ref 4.22–5.81)
RDW: 12.9 % (ref 11.5–14.6)
WBC: 7.4 10*3/uL (ref 4.5–10.5)

## 2013-09-26 ENCOUNTER — Ambulatory Visit
Admission: RE | Admit: 2013-09-26 | Discharge: 2013-09-26 | Disposition: A | Discharge status: Home / Self Care | Payer: BC Managed Care – PPO | Source: Ambulatory Visit | Attending: Otolaryngology | Admitting: Otolaryngology

## 2013-09-26 DIAGNOSIS — D333 Benign neoplasm of cranial nerves: Secondary | ICD-10-CM

## 2013-09-26 MED ORDER — GADOBENATE DIMEGLUMINE 529 MG/ML IV SOLN: 20.0000 mL | Freq: Once | INTRAVENOUS | Status: AC | PRN

## 2013-09-26 MED ORDER — GADOBENATE DIMEGLUMINE 529 MG/ML IV SOLN
20.0000 mL | Freq: Once | INTRAVENOUS | Status: AC | PRN
  Administered 2013-09-26: 20 mL via INTRAVENOUS

## 2013-10-03 ENCOUNTER — Other Ambulatory Visit: Payer: Self-pay | Admitting: Family Medicine

## 2013-10-03 NOTE — Telephone Encounter (Signed)
Med filled.  

## 2013-12-12 ENCOUNTER — Other Ambulatory Visit: Payer: Self-pay | Admitting: Family Medicine

## 2013-12-12 NOTE — Telephone Encounter (Signed)
Med filled.  

## 2013-12-13 ENCOUNTER — Other Ambulatory Visit: Payer: Self-pay | Admitting: Family Medicine

## 2013-12-13 NOTE — Telephone Encounter (Signed)
Med filled.  

## 2014-01-28 ENCOUNTER — Encounter: Payer: Self-pay | Admitting: General Practice

## 2014-01-28 ENCOUNTER — Ambulatory Visit (INDEPENDENT_AMBULATORY_CARE_PROVIDER_SITE_OTHER): Payer: BC Managed Care – PPO | Admitting: Family Medicine

## 2014-01-28 ENCOUNTER — Encounter: Payer: Self-pay | Admitting: Family Medicine

## 2014-01-28 VITALS — BP 124/84 | HR 69 | Temp 98.1°F | Resp 16 | Wt 230.4 lb

## 2014-01-28 DIAGNOSIS — E785 Hyperlipidemia, unspecified: Secondary | ICD-10-CM

## 2014-01-28 DIAGNOSIS — I10 Essential (primary) hypertension: Secondary | ICD-10-CM

## 2014-01-28 LAB — HEPATIC FUNCTION PANEL
ALT: 29 U/L (ref 0–53)
AST: 24 U/L (ref 0–37)
Albumin: 4.5 g/dL (ref 3.5–5.2)
Alkaline Phosphatase: 60 U/L (ref 39–117)
Bilirubin, Direct: 0 mg/dL (ref 0.0–0.3)
Total Bilirubin: 0.9 mg/dL (ref 0.2–1.2)
Total Protein: 7.6 g/dL (ref 6.0–8.3)

## 2014-01-28 LAB — BASIC METABOLIC PANEL
BUN: 16 mg/dL (ref 6–23)
CALCIUM: 9.6 mg/dL (ref 8.4–10.5)
CO2: 27 mEq/L (ref 19–32)
CREATININE: 1 mg/dL (ref 0.4–1.5)
Chloride: 104 mEq/L (ref 96–112)
GFR: 89.74 mL/min (ref >60.00)
Glucose, Bld: 89 mg/dL (ref 70–99)
Potassium: 4 mEq/L (ref 3.5–5.1)
SODIUM: 140 meq/L (ref 135–145)

## 2014-01-28 LAB — LIPID PANEL
Cholesterol: 139 mg/dL (ref 0–200)
HDL: 44.1 mg/dL (ref >39.00)
LDL CALC: 76 mg/dL (ref 0–99)
NONHDL: 94.9
Total CHOL/HDL Ratio: 3
Triglycerides: 97 mg/dL (ref 0.0–149.0)
VLDL: 19.4 mg/dL (ref 0.0–40.0)

## 2014-01-28 NOTE — Assessment & Plan Note (Signed)
Chronic problem.  Tolerating statin w/o difficulty.  Check labs.  Adjust meds prn  

## 2014-01-28 NOTE — Assessment & Plan Note (Signed)
Chronic problem.  Well controlled.  Asymptomatic.  Check labs.  No anticipated med changes. 

## 2014-01-28 NOTE — Patient Instructions (Signed)
Schedule your complete physical in 6 months We'll notify you of your lab results and make any changes if needed Keep up the good work!  You look great!!! Call with any questions or concerns Happy Labor Day!!! 

## 2014-01-28 NOTE — Progress Notes (Signed)
   Subjective:    Patient ID: Gabriel Oconnell, male    DOB: June 30, 1965, 48 y.o.   MRN: [id number]  HPI HTN- chronic problem, on Valsartan-HCTZ and amlodipine.  No CP, SOB, HAs, visual changes, edema.  Exercising regularly and watching diet.  Hyperlipidemia- chronic problem, on Simvastatin.  No abd pain, N/V, myalgias.   Review of Systems For ROS see HPI     Objective:   Physical Exam  Vitals reviewed. Constitutional: He is oriented to person, place, and time. He appears well-developed and well-nourished. No distress.  HENT:  Head: Normocephalic and atraumatic.  Eyes: Conjunctivae and EOM are normal. Pupils are equal, round, and reactive to light.  Neck: Normal range of motion. Neck supple. No thyromegaly present.  Cardiovascular: Normal rate, regular rhythm, normal heart sounds and intact distal pulses.   No murmur heard. Pulmonary/Chest: Effort normal and breath sounds normal. No respiratory distress.  Abdominal: Soft. Bowel sounds are normal. He exhibits no distension.  Musculoskeletal: He exhibits no edema.  Lymphadenopathy:    He has no cervical adenopathy.  Neurological: He is alert and oriented to person, place, and time. No cranial nerve deficit.  Skin: Skin is warm and dry.  Psychiatric: He has a normal mood and affect. His behavior is normal.          Assessment & Plan:

## 2014-01-29 ENCOUNTER — Ambulatory Visit: Payer: BC Managed Care – PPO | Admitting: Family Medicine

## 2014-01-31 ENCOUNTER — Other Ambulatory Visit: Payer: Self-pay | Admitting: Family Medicine

## 2014-01-31 NOTE — Telephone Encounter (Signed)
Med filled.  

## 2014-02-06 LAB — CBC AND DIFFERENTIAL
HEMATOCRIT: 42 % (ref 41–53)
Hemoglobin: 14.9 g/dL (ref 13.5–17.5)
PLATELETS: 255 10*3/uL (ref 150–399)
WBC: 7.7 10^3/mL

## 2014-02-06 LAB — HEPATIC FUNCTION PANEL
ALK PHOS: 72 U/L (ref 25–125)
ALT: 22 U/L (ref 10–40)
AST: 20 U/L (ref 14–40)
BILIRUBIN, TOTAL: 0.7 mg/dL
Bilirubin, Direct: 0.13 mg/dL

## 2014-02-06 LAB — LIPID PANEL
CHOLESTEROL: 157 mg/dL (ref 0–200)
HDL: 46 mg/dL (ref 35–70)
LDL Cholesterol: 78 mg/dL
LDl/HDL Ratio: 3.4
TRIGLYCERIDES: 165 mg/dL — AB (ref 40–160)

## 2014-02-06 LAB — BASIC METABOLIC PANEL
BUN: 16 mg/dL (ref 4–21)
Creatinine: 1 mg/dL (ref <=1.3)
Sodium: 141 mmol/L (ref 137–147)

## 2014-02-08 ENCOUNTER — Encounter: Payer: Self-pay | Admitting: General Practice

## 2014-02-21 ENCOUNTER — Other Ambulatory Visit: Payer: Self-pay | Admitting: Family Medicine

## 2014-02-22 ENCOUNTER — Other Ambulatory Visit: Payer: Self-pay | Admitting: Family Medicine

## 2014-02-22 NOTE — Telephone Encounter (Signed)
Med filled.  

## 2014-03-04 ENCOUNTER — Encounter: Payer: Self-pay | Admitting: General Practice

## 2014-06-21 ENCOUNTER — Other Ambulatory Visit: Payer: Self-pay | Admitting: Family Medicine

## 2014-06-24 NOTE — Telephone Encounter (Signed)
Med filled.  

## 2014-06-27 ENCOUNTER — Encounter: Payer: Self-pay | Admitting: Family Medicine

## 2014-06-27 ENCOUNTER — Ambulatory Visit (INDEPENDENT_AMBULATORY_CARE_PROVIDER_SITE_OTHER): Payer: BLUE CROSS/BLUE SHIELD | Admitting: Family Medicine

## 2014-06-27 VITALS — BP 122/82 | HR 86 | Temp 98.2°F | Resp 16 | Wt 243.0 lb

## 2014-06-27 DIAGNOSIS — M79672 Pain in left foot: Secondary | ICD-10-CM

## 2014-06-27 NOTE — Progress Notes (Signed)
   Subjective:    Patient ID: Gabriel Oconnell, male    DOB: 1966-01-19, 49 y.o.   MRN: [id number]  HPI Foot pain- L foot, pain along 5th metatarsal laterally.  No pain on bottom of foot.  No known injury.  No change in footwear.  No change or increase in activity level.  + burning sensation.  Pain is intermittent.  Today pain is 1-2/10, as day goes on, will be 7-8.  Pt reports shoe size has increased recently.   Review of Systems For ROS see HPI     Objective:   Physical Exam  Constitutional: He is oriented to person, place, and time. He appears well-developed and well-nourished. No distress.  Cardiovascular: Intact distal pulses.   Musculoskeletal: He exhibits tenderness (mild TTP over tuberosity of L 5th metatarsal). He exhibits no edema.  Neurological: He is alert and oriented to person, place, and time. Coordination normal.  Skin: Skin is warm and dry. No erythema.  Vitals reviewed.         Assessment & Plan:

## 2014-06-27 NOTE — Progress Notes (Signed)
Pre visit review using our clinic review tool, if applicable. No additional management support is needed unless otherwise documented below in the visit note. 

## 2014-06-27 NOTE — Patient Instructions (Signed)
Follow up as needed Take 1-2 Aleve twice daily as needed for pain Wear good supportive shoes We'll call you with your Sports Med appt Call with any questions or concerns Hang in there! Happy Early Rudene Anda!

## 2014-06-27 NOTE — Assessment & Plan Note (Signed)
New.  Suspect nerve impingement due to foot spreading or daily use of steel toed boots w/o cushion/support.  Start NSAIDs prn.  Refer to sports med for complete evaluation and likely orthotics.  Pt expressed understanding and is in agreement w/ plan.

## 2014-07-01 ENCOUNTER — Ambulatory Visit (INDEPENDENT_AMBULATORY_CARE_PROVIDER_SITE_OTHER): Payer: BLUE CROSS/BLUE SHIELD | Admitting: Family Medicine

## 2014-07-01 ENCOUNTER — Encounter: Payer: Self-pay | Admitting: Family Medicine

## 2014-07-01 VITALS — BP 153/94 | HR 71 | Ht 72.0 in | Wt 230.0 lb

## 2014-07-01 DIAGNOSIS — M79672 Pain in left foot: Secondary | ICD-10-CM

## 2014-07-01 NOTE — Patient Instructions (Signed)
This is most consistent with a local nerve irritation though you also have elements of insertional peroneal tendinopathy. Start home exercises we reviewed - theraband (or trash can) and calf raises - 3 sets of 10 once a day for next 6 weeks even if you're feeling better sooner than this. Icing as needed 15 minutes at a time 3-4 times a day. May want to try regular Aleve 2 tabs twice a day with food for pain and inflammation for 7-10 days then as needed. Dr. Zoe Lan active series insoles. Avoid barefoot walking as much as possible. Follow up with me in 6 weeks for reevaluation.

## 2014-07-03 ENCOUNTER — Encounter: Payer: Self-pay | Admitting: Family Medicine

## 2014-07-03 NOTE — Progress Notes (Signed)
PCP and referred by: Annye Asa, MD  Subjective:   HPI: Patient is a 49 y.o. male here for left foot pain.  Patient denies known injury. He states about 1 month ago he just woke up with a burning sensation lateral left foot. This comes and goes. Not associated with any particular movements of the foot or times of day. No swelling or bruising. Tried aleve. Doesn't do a lot of walking at work and doesn't run/jog for exercise (does use exercise bike). No prior injuries.  Past Medical History  Diagnosis Date  . Hypertension   . Hyperlipidemia   . Acoustic neuroma     Current Outpatient Prescriptions on File Prior to Visit  Medication Sig Dispense Refill  . amLODipine (NORVASC) 10 MG tablet TAKE 1 TABLET DAILY 90 tablet 1  . aspirin (BAYER LOW DOSE) 81 MG chewable tablet Chew 81 mg by mouth daily.    Marland Kitchen glucosamine-chondroitin 500-400 MG tablet Take 1 tablet by mouth daily.      . Multiple Vitamins-Minerals (MULTIVITAMIN,TX-MINERALS) tablet Take 1 tablet by mouth daily.      . Potassium (POTASSIMIN PO) Take 2 tablets by mouth daily.    . simvastatin (ZOCOR) 20 MG tablet Take 1 tablet (20 mg total) by mouth daily. 90 tablet 0  . valsartan-hydrochlorothiazide (DIOVAN-HCT) 320-12.5 MG per tablet TAKE 1 TABLET DAILY 90 tablet 1   No current facility-administered medications on file prior to visit.    Past Surgical History  Procedure Laterality Date  . Knee surgery      left    No Known Allergies  History   Social History  . Marital Status: Single    Spouse Name: N/A    Number of Children: N/A  . Years of Education: N/A   Occupational History  . Not on file.   Social History Main Topics  . Smoking status: Never Smoker   . Smokeless tobacco: Never Used  . Alcohol Use: 1.2 oz/week    2 Not specified per week     Comment: several drinks per month  . Drug Use: No  . Sexual Activity: Not on file   Other Topics Concern  . Not on file   Social History Narrative     Family History  Problem Relation Age of Onset  . Hypertension    . Pancreatic cancer    . Pancreatic cancer Father   . Colon polyps Father   . Colon cancer Father   . Colon cancer Paternal Grandmother     BP 153/94 mmHg  Pulse 71  Ht 6' (1.829 m)  Wt 230 lb (104.327 kg)  BMI 31.19 kg/m2  Review of Systems: See HPI above.    Objective:  Physical Exam:  Gen: NAD  Left foot/ankle: No gross deformity, swelling, ecchymoses.  Mod overpronation.  Wide forefoot. FROM ankle without pain. TTP mildly base 5th metatarsal and at peroneus brevis insertion on 5th metatarsal. Negative ant drawer and talar tilt.   Negative syndesmotic compression. Negative metatarsal squeeze. Thompsons test negative. NV intact distally.  MSK u/s:  No evidence cortical irregularity, edema, or neovascularity of 5th metatarsal to suggest stress fracture or other bony abnormalities.  Peroneus brevis intact without tear or increased tendon sheath fluid.    Assessment & Plan:  1. Left foot pain - Consistent with local nerve irritation though has some elements of insertional peroneal tendinopathy.  Start home exercise program which was reviewed today.  Icing, regular nsaids, and better arch support should help.  Avoid barefoot  walking.  F/u in 6 weeks for reevaluation.

## 2014-07-03 NOTE — Assessment & Plan Note (Signed)
Consistent with local nerve irritation though has some elements of insertional peroneal tendinopathy.  Start home exercise program which was reviewed today.  Icing, regular nsaids, and better arch support should help.  Avoid barefoot walking.  F/u in 6 weeks for reevaluation.

## 2014-07-13 ENCOUNTER — Other Ambulatory Visit: Payer: Self-pay | Admitting: Family Medicine

## 2014-07-15 NOTE — Telephone Encounter (Signed)
Med filled.  

## 2014-08-12 ENCOUNTER — Ambulatory Visit (INDEPENDENT_AMBULATORY_CARE_PROVIDER_SITE_OTHER): Payer: BLUE CROSS/BLUE SHIELD | Admitting: Family Medicine

## 2014-08-12 ENCOUNTER — Encounter: Payer: Self-pay | Admitting: Family Medicine

## 2014-08-12 VITALS — BP 130/85 | HR 88 | Ht 72.0 in | Wt 230.0 lb

## 2014-08-12 DIAGNOSIS — M79672 Pain in left foot: Secondary | ICD-10-CM

## 2014-08-12 MED ORDER — NITROGLYCERIN 0.2 MG/HR TD PT24: MEDICATED_PATCH | TRANSDERMAL | Status: DC

## 2014-08-12 NOTE — Patient Instructions (Signed)
You have insertional peroneal tendinopathy with local nerve irritation. Continue home exercises we reviewed - theraband (or trash can) and calf raises - 3 sets of 10 once a day for next 6 weeks. Toe walking, heel walking as well. Icing as needed 15 minutes at a time 3-4 times a day. May want to try regular Aleve 2 tabs twice a day with food for pain and inflammation for 7-10 days then as needed. Dr. Zoe Lan active series insoles. Avoid barefoot walking as much as possible. Start nitro patches - 1/4th patch over affected area, change daily. Consider physical therapy. Follow up with me in 6 weeks for reevaluation.

## 2014-08-13 NOTE — Progress Notes (Signed)
PCP and referred by: Annye Asa, MD  Subjective:   HPI: Patient is a 49 y.o. male here for left foot pain.  2/1: Patient denies known injury. He states about 1 month ago he just woke up with a burning sensation lateral left foot. This comes and goes. Not associated with any particular movements of the foot or times of day. No swelling or bruising. Tried aleve. Doesn't do a lot of walking at work and doesn't run/jog for exercise (does use exercise bike). No prior injuries.  3/14: Patient reports he feels maybe a little better compraed to last visit. Pain is 4/10. Doing home exercises. Pain level changes throughout the day. No swelling. No new injuries. Wearing dr. Zoe Lan active series insoles now.  Past Medical History  Diagnosis Date  . Hypertension   . Hyperlipidemia   . Acoustic neuroma     Current Outpatient Prescriptions on File Prior to Visit  Medication Sig Dispense Refill  . amLODipine (NORVASC) 10 MG tablet TAKE 1 TABLET DAILY 90 tablet 1  . aspirin (BAYER LOW DOSE) 81 MG chewable tablet Chew 81 mg by mouth daily.    Marland Kitchen glucosamine-chondroitin 500-400 MG tablet Take 1 tablet by mouth daily.      . Multiple Vitamins-Minerals (MULTIVITAMIN,TX-MINERALS) tablet Take 1 tablet by mouth daily.      . Potassium (POTASSIMIN PO) Take 2 tablets by mouth daily.    . simvastatin (ZOCOR) 20 MG tablet Take 1 tablet (20 mg total) by mouth daily. 90 tablet 0  . valsartan-hydrochlorothiazide (DIOVAN-HCT) 320-12.5 MG per tablet TAKE 1 TABLET DAILY 90 tablet 1   No current facility-administered medications on file prior to visit.    Past Surgical History  Procedure Laterality Date  . Knee surgery      left    No Known Allergies  History   Social History  . Marital Status: Single    Spouse Name: N/A  . Number of Children: N/A  . Years of Education: N/A   Occupational History  . Not on file.   Social History Main Topics  . Smoking status: Never Smoker   .  Smokeless tobacco: Never Used  . Alcohol Use: 1.2 oz/week    2 Standard drinks or equivalent per week     Comment: several drinks per month  . Drug Use: No  . Sexual Activity: Not on file   Other Topics Concern  . Not on file   Social History Narrative    Family History  Problem Relation Age of Onset  . Hypertension    . Pancreatic cancer    . Pancreatic cancer Father   . Colon polyps Father   . Colon cancer Father   . Colon cancer Paternal Grandmother     BP 130/85 mmHg  Pulse 88  Ht 6' (1.829 m)  Wt 230 lb (104.327 kg)  BMI 31.19 kg/m2  Review of Systems: See HPI above.    Objective:  Physical Exam:  Gen: NAD  Left foot/ankle: No gross deformity, swelling, ecchymoses.  Mod overpronation.  Wide forefoot. FROM ankle but mild pain with resisted external rotation. TTP mildly at peroneus brevis insertion on 5th metatarsal. Negative ant drawer and talar tilt.   Negative syndesmotic compression. Negative metatarsal squeeze. Thompsons test negative. NV intact distally.    Assessment & Plan:  1. Left foot pain - Consistent with local nerve irritation though has some elements of insertional peroneal tendinopathy.  Continue with dr. Zoe Lan active series insoles and home exercises.  Will add  nitro patches - discussed risks of headache, skin irritation.  Consider physical therapy, neuro referrals if not improving.  F/u in 6 weeks.

## 2014-08-13 NOTE — Assessment & Plan Note (Signed)
Consistent with local nerve irritation though has some elements of insertional peroneal tendinopathy.  Continue with dr. Zoe Lan active series insoles and home exercises.  Will add nitro patches - discussed risks of headache, skin irritation.  Consider physical therapy, neuro referrals if not improving.  F/u in 6 weeks.

## 2014-09-18 LAB — HEPATIC FUNCTION PANEL
ALK PHOS: 67 U/L (ref 25–125)
ALT: 27 U/L (ref 10–40)
AST: 26 U/L (ref 14–40)
Bilirubin, Direct: 0.16 mg/dL
Bilirubin, Total: 0.7 mg/dL

## 2014-09-18 LAB — BASIC METABOLIC PANEL
BUN: 16 mg/dL (ref 4–21)
Creatinine: 0.9 mg/dL (ref <=1.3)
Potassium: 4.1 mmol/L (ref 3.4–5.3)
Sodium: 142 mmol/L (ref 137–147)

## 2014-09-18 LAB — LIPID PANEL
Cholesterol: 155 mg/dL (ref 0–200)
HDL: 46 mg/dL (ref 35–70)
LDL CALC: 82 mg/dL
LDl/HDL Ratio: 3.4
Triglycerides: 133 mg/dL (ref 40–160)

## 2014-09-18 LAB — CBC AND DIFFERENTIAL
HCT: 43 % (ref 41–53)
Hemoglobin: 14.4 g/dL (ref 13.5–17.5)
PLATELETS: 236 10*3/uL (ref 150–399)
WBC: 7.3 10^3/mL

## 2014-09-20 ENCOUNTER — Other Ambulatory Visit: Payer: Self-pay | Admitting: Family Medicine

## 2014-09-20 NOTE — Telephone Encounter (Signed)
Med filled.  

## 2014-09-23 ENCOUNTER — Ambulatory Visit (INDEPENDENT_AMBULATORY_CARE_PROVIDER_SITE_OTHER): Payer: BLUE CROSS/BLUE SHIELD | Admitting: Family Medicine

## 2014-09-23 ENCOUNTER — Encounter: Payer: Self-pay | Admitting: Family Medicine

## 2014-09-23 VITALS — BP 148/96 | HR 73 | Ht 72.0 in | Wt 230.0 lb

## 2014-09-23 DIAGNOSIS — M79672 Pain in left foot: Secondary | ICD-10-CM

## 2014-09-25 NOTE — Progress Notes (Signed)
PCP and referred by: Annye Asa, MD  Subjective:   HPI: Patient is a 49 y.o. male here for left foot pain.  2/1: Patient denies known injury. He states about 1 month ago he just woke up with a burning sensation lateral left foot. This comes and goes. Not associated with any particular movements of the foot or times of day. No swelling or bruising. Tried aleve. Doesn't do a lot of walking at work and doesn't run/jog for exercise (does use exercise bike). No prior injuries.  3/14: Patient reports he feels maybe a little better compraed to last visit. Pain is 4/10. Doing home exercises. Pain level changes throughout the day. No swelling. No new injuries. Wearing dr. Zoe Lan active series insoles now.  4/25: Patient reports he is doing extremely well. Nitro patches seem to have helped the most. Pain 1/10 but only really if he pushes hard on the area of pain. Walked 15 miles this weekend without increase in pain.  Past Medical History  Diagnosis Date  . Hypertension   . Hyperlipidemia   . Acoustic neuroma     Current Outpatient Prescriptions on File Prior to Visit  Medication Sig Dispense Refill  . amLODipine (NORVASC) 10 MG tablet TAKE 1 TABLET DAILY 90 tablet 1  . aspirin (BAYER LOW DOSE) 81 MG chewable tablet Chew 81 mg by mouth daily.    Marland Kitchen glucosamine-chondroitin 500-400 MG tablet Take 1 tablet by mouth daily.      . Multiple Vitamins-Minerals (MULTIVITAMIN,TX-MINERALS) tablet Take 1 tablet by mouth daily.      . nitroGLYCERIN (NITRODUR - DOSED IN MG/24 HR) 0.2 mg/hr patch Apply 1/4th patch over affected area, change daily 30 patch 1  . Potassium (POTASSIMIN PO) Take 2 tablets by mouth daily.    . simvastatin (ZOCOR) 20 MG tablet TAKE 1 TABLET DAILY 90 tablet 0  . valsartan-hydrochlorothiazide (DIOVAN-HCT) 320-12.5 MG per tablet TAKE 1 TABLET DAILY 90 tablet 1   No current facility-administered medications on file prior to visit.    Past Surgical History   Procedure Laterality Date  . Knee surgery      left    No Known Allergies  History   Social History  . Marital Status: Single    Spouse Name: N/A  . Number of Children: N/A  . Years of Education: N/A   Occupational History  . Not on file.   Social History Main Topics  . Smoking status: Never Smoker   . Smokeless tobacco: Never Used  . Alcohol Use: 1.2 oz/week    2 Standard drinks or equivalent per week     Comment: several drinks per month  . Drug Use: No  . Sexual Activity: Not on file   Other Topics Concern  . Not on file   Social History Narrative    Family History  Problem Relation Age of Onset  . Hypertension    . Pancreatic cancer    . Pancreatic cancer Father   . Colon polyps Father   . Colon cancer Father   . Colon cancer Paternal Grandmother     BP 148/96 mmHg  Pulse 73  Ht 6' (1.829 m)  Wt 230 lb (104.327 kg)  BMI 31.19 kg/m2  Review of Systems: See HPI above.    Objective:  Physical Exam:  Gen: NAD  Left foot/ankle: No gross deformity, swelling, ecchymoses.  Mod overpronation.  Wide forefoot. FROM ankle without pain. TTP minimally at peroneus brevis insertion on 5th metatarsal. Negative ant drawer and talar tilt.  Negative syndesmotic compression. Negative metatarsal squeeze. Thompsons test negative. NV intact distally.    Assessment & Plan:  1. Left foot pain - Consistent with local nerve irritation though has some elements of insertional peroneal tendinopathy. Much improved with nitro patches - advised to go ahead with the patches for 6 more weeks then discontinue.  Stop the exercises that are causing more pain.  F/u in 6 weeks or prn.

## 2014-09-25 NOTE — Assessment & Plan Note (Signed)
Consistent with local nerve irritation though has some elements of insertional peroneal tendinopathy. Much improved with nitro patches - advised to go ahead with the patches for 6 more weeks then discontinue.  Stop the exercises that are causing more pain.  F/u in 6 weeks or prn.

## 2014-10-14 ENCOUNTER — Encounter: Payer: Self-pay | Admitting: General Practice

## 2014-10-21 ENCOUNTER — Encounter: Payer: Self-pay | Admitting: Family Medicine

## 2014-12-04 ENCOUNTER — Telehealth: Payer: Self-pay | Admitting: Family Medicine

## 2014-12-04 NOTE — Telephone Encounter (Signed)
Pre visit letter mailed 12/04/14

## 2014-12-23 ENCOUNTER — Other Ambulatory Visit: Payer: Self-pay | Admitting: Family Medicine

## 2014-12-24 ENCOUNTER — Telehealth: Payer: Self-pay | Admitting: *Deleted

## 2014-12-24 NOTE — Telephone Encounter (Signed)
Unable to reach patient at time of Pre-Visit Call.  Left message for patient to return call when available.    

## 2014-12-25 ENCOUNTER — Ambulatory Visit (INDEPENDENT_AMBULATORY_CARE_PROVIDER_SITE_OTHER): Payer: BLUE CROSS/BLUE SHIELD | Admitting: Family Medicine

## 2014-12-25 ENCOUNTER — Encounter: Payer: BLUE CROSS/BLUE SHIELD | Admitting: Family Medicine

## 2014-12-25 ENCOUNTER — Encounter: Payer: Self-pay | Admitting: Family Medicine

## 2014-12-25 VITALS — BP 132/84 | HR 80 | Temp 98.1°F | Ht 72.0 in | Wt 240.0 lb

## 2014-12-25 DIAGNOSIS — Z0000 Encounter for general adult medical examination without abnormal findings: Secondary | ICD-10-CM

## 2014-12-25 NOTE — Assessment & Plan Note (Signed)
Pt's PE WNL.  Due for colonoscopy next year.  Check labs.  Anticipatory guidance provided.

## 2014-12-25 NOTE — Progress Notes (Signed)
Pre visit review using our clinic review tool, if applicable. No additional management support is needed unless otherwise documented below in the visit note. 

## 2014-12-25 NOTE — Progress Notes (Signed)
   Subjective:    Patient ID: Gabriel Oconnell, male    DOB: 15-Jan-1966, 49 y.o.   MRN: [id number]  HPI CPE- no concerns today.     Review of Systems Patient reports no vision/hearing changes, anorexia, fever ,adenopathy, persistant/recurrent hoarseness, swallowing issues, chest pain, palpitations, edema, persistant/recurrent cough, hemoptysis, dyspnea (rest,exertional, paroxysmal nocturnal), gastrointestinal  bleeding (melena, rectal bleeding), abdominal pain, excessive heart burn, GU symptoms (dysuria, hematuria, voiding/incontinence issues) syncope, focal weakness, memory loss, numbness & tingling, skin/hair/nail changes, depression, anxiety, abnormal bruising/bleeding, musculoskeletal symptoms/signs.     Objective:   Physical Exam BP 132/84 mmHg  Pulse 80  Temp(Src) 98.1 F (36.7 C) (Oral)  Ht 6' (1.829 m)  Wt 240 lb (108.863 kg)  BMI 32.54 kg/m2  SpO2 96%  General Appearance:    Alert, cooperative, no distress, appears stated age  Head:    Normocephalic, without obvious abnormality, atraumatic  Eyes:    PERRL, conjunctiva/corneas clear, EOM's intact, fundi    benign, both eyes       Ears:    Normal TM's and external ear canals, both ears  Nose:   Nares normal, septum midline, mucosa normal, no drainage   or sinus tenderness  Throat:   Lips, mucosa, and tongue normal; teeth and gums normal  Neck:   Supple, symmetrical, trachea midline, no adenopathy;       thyroid:  No enlargement/tenderness/nodules  Back:     Symmetric, no curvature, ROM normal, no CVA tenderness  Lungs:     Clear to auscultation bilaterally, respirations unlabored  Chest wall:    No tenderness or deformity  Heart:    Regular rate and rhythm, S1 and S2 normal, no murmur, rub   or gallop  Abdomen:     Soft, non-tender, bowel sounds active all four quadrants,    no masses, no organomegaly  Genitalia:    Normal male without lesion, discharge or tenderness  Rectal:    Normal tone, normal prostate, no masses or  tenderness  Extremities:   Extremities normal, atraumatic, no cyanosis or edema  Pulses:   2+ and symmetric all extremities  Skin:   Skin color, texture, turgor normal, no rashes or lesions  Lymph nodes:   Cervical, supraclavicular, and axillary nodes normal  Neurologic:   CNII-XII intact. Normal strength, sensation and reflexes      throughout         Assessment & Plan:

## 2014-12-25 NOTE — Patient Instructions (Signed)
Follow up in 6 months to recheck BP We'll notify you of your lab results and make any changes if needed Keep up the good work on healthy diet and regular exercise Call with any questions or concerns Have a great time in [place]!!!

## 2014-12-26 LAB — CBC WITH DIFFERENTIAL/PLATELET
Basophils Absolute: 0 10*3/uL (ref 0.0–0.1)
Basophils Relative: 0.3 % (ref 0.0–3.0)
Eosinophils Absolute: 0.1 10*3/uL (ref 0.0–0.7)
Eosinophils Relative: 1.1 % (ref 0.0–5.0)
HEMATOCRIT: 41.9 % (ref 39.0–52.0)
Hemoglobin: 14.4 g/dL (ref 13.0–17.0)
LYMPHS ABS: 2.6 10*3/uL (ref 0.7–4.0)
Lymphocytes Relative: 31.4 % (ref 12.0–46.0)
MCHC: 34.3 g/dL (ref 30.0–36.0)
MCV: 96.6 fl (ref 78.0–100.0)
Monocytes Absolute: 0.6 10*3/uL (ref 0.1–1.0)
Monocytes Relative: 7.1 % (ref 3.0–12.0)
Neutro Abs: 5 10*3/uL (ref 1.4–7.7)
Neutrophils Relative %: 60.1 % (ref 43.0–77.0)
Platelets: 242 10*3/uL (ref 150.0–400.0)
RBC: 4.34 Mil/uL (ref 4.22–5.81)
RDW: 12.9 % (ref 11.5–15.5)
WBC: 8.3 10*3/uL (ref 4.0–10.5)

## 2014-12-26 LAB — BASIC METABOLIC PANEL
BUN: 12 mg/dL (ref 6–23)
CALCIUM: 9.6 mg/dL (ref 8.4–10.5)
CHLORIDE: 102 meq/L (ref 96–112)
CO2: 30 meq/L (ref 19–32)
Creatinine, Ser: 0.93 mg/dL (ref 0.40–1.50)
GFR: 91.62 mL/min (ref >60.00)
Glucose, Bld: 86 mg/dL (ref 70–99)
POTASSIUM: 3.8 meq/L (ref 3.5–5.1)
Sodium: 139 mEq/L (ref 135–145)

## 2014-12-26 LAB — LIPID PANEL
Cholesterol: 155 mg/dL (ref 0–200)
HDL: 43.2 mg/dL (ref >39.00)
LDL CALC: 88 mg/dL (ref 0–99)
NonHDL: 111.77
Total CHOL/HDL Ratio: 4
Triglycerides: 117 mg/dL (ref 0.0–149.0)
VLDL: 23.4 mg/dL (ref 0.0–40.0)

## 2014-12-26 LAB — HEPATIC FUNCTION PANEL
ALBUMIN: 4.7 g/dL (ref 3.5–5.2)
ALT: 25 U/L (ref 0–53)
AST: 19 U/L (ref 0–37)
Alkaline Phosphatase: 69 U/L (ref 39–117)
BILIRUBIN TOTAL: 0.6 mg/dL (ref 0.2–1.2)
Bilirubin, Direct: 0.1 mg/dL (ref 0.0–0.3)
Total Protein: 7.3 g/dL (ref 6.0–8.3)

## 2014-12-26 LAB — PSA: PSA: 1.04 ng/mL (ref 0.10–4.00)

## 2014-12-26 LAB — TSH: TSH: 0.36 u[IU]/mL (ref 0.35–4.50)

## 2015-01-14 ENCOUNTER — Other Ambulatory Visit: Payer: Self-pay | Admitting: Family Medicine

## 2015-01-14 NOTE — Telephone Encounter (Signed)
Medication filled to pharmacy as requested.   

## 2015-03-21 ENCOUNTER — Other Ambulatory Visit: Payer: Self-pay | Admitting: Internal Medicine

## 2015-03-21 NOTE — Telephone Encounter (Signed)
Medication filled to pharmacy as requested.   

## 2015-04-28 ENCOUNTER — Encounter: Payer: Self-pay | Admitting: Family Medicine

## 2015-04-28 ENCOUNTER — Ambulatory Visit (INDEPENDENT_AMBULATORY_CARE_PROVIDER_SITE_OTHER): Payer: BLUE CROSS/BLUE SHIELD | Admitting: Family Medicine

## 2015-04-28 VITALS — BP 131/93 | HR 67 | Ht 72.0 in | Wt 230.0 lb

## 2015-04-28 DIAGNOSIS — M79671 Pain in right foot: Secondary | ICD-10-CM

## 2015-04-28 MED ORDER — NITROGLYCERIN 0.2 MG/HR TD PT24: MEDICATED_PATCH | TRANSDERMAL | Status: DC

## 2015-04-28 NOTE — Patient Instructions (Signed)
You have insertional peroneal tendinopathy with local nerve irritation. Start nitro patches - 1/4th patch over affected area, change daily. Consider arch supports, exercises as you did previously. Follow up with me in 6 weeks for reevaluation.

## 2015-04-30 DIAGNOSIS — M79671 Pain in right foot: Secondary | ICD-10-CM | POA: Insufficient documentation

## 2015-04-30 NOTE — Assessment & Plan Note (Signed)
Consistent with local nerve irritation though has some elements of insertional peroneal tendinopathy.  Did extremely well with nitro patches on left after home exercises, arch supports did not help to resolve this.  Will start patches for this as well.  F/u in 6 weeks for reevaluation.

## 2015-04-30 NOTE — Progress Notes (Signed)
PCP and referred by: Annye Asa, MD  Subjective:   HPI: Patient is a 49 y.o. male here for right foot pain.  2/1: Patient denies known injury. He states about 1 month ago he just woke up with a burning sensation lateral left foot. This comes and goes. Not associated with any particular movements of the foot or times of day. No swelling or bruising. Tried aleve. Doesn't do a lot of walking at work and doesn't run/jog for exercise (does use exercise bike). No prior injuries.  3/14: Patient reports he feels maybe a little better compraed to last visit. Pain is 4/10. Doing home exercises. Pain level changes throughout the day. No swelling. No new injuries. Wearing dr. Zoe Lan active series insoles now.  4/25: Patient reports he is doing extremely well. Nitro patches seem to have helped the most. Pain 1/10 but only really if he pushes hard on the area of pain. Walked 15 miles this weekend without increase in pain.  11/28: Patient reports he is now having the same problem on right foot he had on the left for 2 months now. Pain level 0/10 at rest up to 7/10 at times, sharp. Worse with a lot of ambulation. Associated burning sensation. Pain comes and goes. No skin changes, fever, other complaints.  Past Medical History  Diagnosis Date  . Hypertension   . Hyperlipidemia   . Acoustic neuroma [facility])     Current Outpatient Prescriptions on File Prior to Visit  Medication Sig Dispense Refill  . amLODipine (NORVASC) 10 MG tablet TAKE 1 TABLET DAILY 90 tablet 1  . aspirin (BAYER LOW DOSE) 81 MG chewable tablet Chew 81 mg by mouth daily.    Marland Kitchen glucosamine-chondroitin 500-400 MG tablet Take 1 tablet by mouth daily.      . Multiple Vitamins-Minerals (MULTIVITAMIN,TX-MINERALS) tablet Take 1 tablet by mouth daily.      . Potassium (POTASSIMIN PO) Take 2 tablets by mouth daily.    . simvastatin (ZOCOR) 20 MG tablet TAKE 1 TABLET DAILY 90 tablet 1  . valsartan-hydrochlorothiazide  (DIOVAN-HCT) 320-12.5 MG per tablet TAKE 1 TABLET DAILY 90 tablet 1   No current facility-administered medications on file prior to visit.    Past Surgical History  Procedure Laterality Date  . Knee surgery      left    No Known Allergies  Social History   Social History  . Marital Status: Single    Spouse Name: N/A  . Number of Children: N/A  . Years of Education: N/A   Occupational History  . Not on file.   Social History Main Topics  . Smoking status: Never Smoker   . Smokeless tobacco: Never Used  . Alcohol Use: 1.2 oz/week    2 Standard drinks or equivalent per week     Comment: several drinks per month  . Drug Use: No  . Sexual Activity: Not on file   Other Topics Concern  . Not on file   Social History Narrative    Family History  Problem Relation Age of Onset  . Hypertension    . Pancreatic cancer    . Pancreatic cancer Father   . Colon polyps Father   . Colon cancer Father   . Colon cancer Paternal Grandmother     BP 131/93 mmHg  Pulse 67  Ht 6' (1.829 m)  Wt 230 lb (104.327 kg)  BMI 31.19 kg/m2  Review of Systems: See HPI above.    Objective:  Physical Exam:  Gen: NAD  Right foot/ankle: No gross deformity, swelling, ecchymoses.  Mod overpronation.  Wide forefoot.  Bunion. FROM ankle without pain.  5/5 strength all motions TTP minimally at peroneus brevis insertion on 5th metatarsal. Negative ant drawer and talar tilt.   Negative syndesmotic compression. Negative metatarsal squeeze. Thompsons test negative. NV intact distally.  Left foot/ankle: FROM without pain.    Assessment & Plan:  1. Right foot pain - Consistent with local nerve irritation though has some elements of insertional peroneal tendinopathy.  Did extremely well with nitro patches on left after home exercises, arch supports did not help to resolve this.  Will start patches for this as well.  F/u in 6 weeks for reevaluation.

## 2015-06-10 ENCOUNTER — Ambulatory Visit: Payer: BLUE CROSS/BLUE SHIELD | Admitting: Family Medicine

## 2015-06-11 ENCOUNTER — Ambulatory Visit (INDEPENDENT_AMBULATORY_CARE_PROVIDER_SITE_OTHER): Payer: BLUE CROSS/BLUE SHIELD | Admitting: Family Medicine

## 2015-06-11 ENCOUNTER — Encounter: Payer: Self-pay | Admitting: Family Medicine

## 2015-06-11 VITALS — BP 126/89 | HR 75 | Ht 72.0 in | Wt 235.0 lb

## 2015-06-11 DIAGNOSIS — M79671 Pain in right foot: Secondary | ICD-10-CM | POA: Diagnosis not present

## 2015-06-11 NOTE — Assessment & Plan Note (Signed)
Consistent with local nerve irritation though has some elements of insertional peroneal tendinopathy.  Doing very well with nitro patches - continue with these - also discussed can increase to 1/2 patch instead of 1/4th patch.  F/u in 6 weeks or prn.

## 2015-06-11 NOTE — Progress Notes (Signed)
PCP and referred by: Annye Asa, MD  Subjective:   HPI: Patient is a 50 y.o. male here for right foot pain.  2/1: Patient denies known injury. He states about 1 month ago he just woke up with a burning sensation lateral left foot. This comes and goes. Not associated with any particular movements of the foot or times of day. No swelling or bruising. Tried aleve. Doesn't do a lot of walking at work and doesn't run/jog for exercise (does use exercise bike). No prior injuries.  3/14: Patient reports he feels maybe a little better compraed to last visit. Pain is 4/10. Doing home exercises. Pain level changes throughout the day. No swelling. No new injuries. Wearing dr. Zoe Lan active series insoles now.  4/25: Patient reports he is doing extremely well. Nitro patches seem to have helped the most. Pain 1/10 but only really if he pushes hard on the area of pain. Walked 15 miles this weekend without increase in pain.  04/28/15: Patient reports he is now having the same problem on right foot he had on the left for 2 months now. Pain level 0/10 at rest up to 7/10 at times, sharp. Worse with a lot of ambulation. Associated burning sensation. Pain comes and goes. No skin changes, fever, other complaints.  06/11/15: Patient reports feeling much better. Pain level 0/10 - feels a twinge occasionally. Tolerating nitro patches. No skin changes, fever, other complaints.  Past Medical History  Diagnosis Date  . Hypertension   . Hyperlipidemia   . Acoustic neuroma [facility])     Current Outpatient Prescriptions on File Prior to Visit  Medication Sig Dispense Refill  . amLODipine (NORVASC) 10 MG tablet TAKE 1 TABLET DAILY 90 tablet 1  . aspirin (BAYER LOW DOSE) 81 MG chewable tablet Chew 81 mg by mouth daily.    Marland Kitchen glucosamine-chondroitin 500-400 MG tablet Take 1 tablet by mouth daily.      . Multiple Vitamins-Minerals (MULTIVITAMIN,TX-MINERALS) tablet Take 1 tablet by mouth  daily.      . nitroGLYCERIN (NITRODUR - DOSED IN MG/24 HR) 0.2 mg/hr patch Apply 1/4th patch over affected area, change daily 30 patch 1  . Potassium (POTASSIMIN PO) Take 2 tablets by mouth daily.    . simvastatin (ZOCOR) 20 MG tablet TAKE 1 TABLET DAILY 90 tablet 1  . valsartan-hydrochlorothiazide (DIOVAN-HCT) 320-12.5 MG per tablet TAKE 1 TABLET DAILY 90 tablet 1   No current facility-administered medications on file prior to visit.    Past Surgical History  Procedure Laterality Date  . Knee surgery      left    No Known Allergies  Social History   Social History  . Marital Status: Single    Spouse Name: N/A  . Number of Children: N/A  . Years of Education: N/A   Occupational History  . Not on file.   Social History Main Topics  . Smoking status: Never Smoker   . Smokeless tobacco: Never Used  . Alcohol Use: 1.2 oz/week    2 Standard drinks or equivalent per week     Comment: several drinks per month  . Drug Use: No  . Sexual Activity: Not on file   Other Topics Concern  . Not on file   Social History Narrative    Family History  Problem Relation Age of Onset  . Hypertension    . Pancreatic cancer    . Pancreatic cancer Father   . Colon polyps Father   . Colon cancer Father   .  Colon cancer Paternal Grandmother     BP 126/89 mmHg  Pulse 75  Ht 6' (1.829 m)  Wt 235 lb (106.595 kg)  BMI 31.86 kg/m2  Review of Systems: See HPI above.    Objective:  Physical Exam:  Gen: NAD  Right foot/ankle: No gross deformity, swelling, ecchymoses.  Mod overpronation.  Wide forefoot.  Bunion. FROM ankle without pain.  5/5 strength all motions TTP minimally at peroneus brevis insertion on 5th metatarsal. Negative ant drawer and talar tilt.   Thompsons test negative. NV intact distally.  Left foot/ankle: FROM without pain.    Assessment & Plan:  1. Right foot pain - Consistent with local nerve irritation though has some elements of insertional peroneal  tendinopathy.  Doing very well with nitro patches - continue with these - also discussed can increase to 1/2 patch instead of 1/4th patch.  F/u in 6 weeks or prn.

## 2015-06-12 ENCOUNTER — Other Ambulatory Visit: Payer: Self-pay | Admitting: Otolaryngology

## 2015-06-12 DIAGNOSIS — D333 Benign neoplasm of cranial nerves: Secondary | ICD-10-CM

## 2015-06-18 ENCOUNTER — Other Ambulatory Visit: Payer: BLUE CROSS/BLUE SHIELD

## 2015-06-26 ENCOUNTER — Ambulatory Visit
Admission: RE | Admit: 2015-06-26 | Discharge: 2015-06-26 | Disposition: A | Discharge status: Home / Self Care | Payer: BLUE CROSS/BLUE SHIELD | Source: Ambulatory Visit | Attending: Otolaryngology | Admitting: Otolaryngology

## 2015-06-26 DIAGNOSIS — D333 Benign neoplasm of cranial nerves: Secondary | ICD-10-CM

## 2015-06-26 MED ORDER — GADOBENATE DIMEGLUMINE 529 MG/ML IV SOLN
20.0000 mL | Freq: Once | INTRAVENOUS | Status: AC | PRN
  Administered 2015-06-26: 20 mL via INTRAVENOUS

## 2015-07-03 ENCOUNTER — Ambulatory Visit (INDEPENDENT_AMBULATORY_CARE_PROVIDER_SITE_OTHER): Payer: BLUE CROSS/BLUE SHIELD | Admitting: Family Medicine

## 2015-07-03 ENCOUNTER — Other Ambulatory Visit: Payer: Self-pay

## 2015-07-03 ENCOUNTER — Encounter: Payer: Self-pay | Admitting: Family Medicine

## 2015-07-03 VITALS — BP 138/91 | HR 68 | Temp 98.5°F | Ht 72.0 in | Wt 244.8 lb

## 2015-07-03 DIAGNOSIS — I10 Essential (primary) hypertension: Secondary | ICD-10-CM | POA: Diagnosis not present

## 2015-07-03 DIAGNOSIS — E785 Hyperlipidemia, unspecified: Secondary | ICD-10-CM

## 2015-07-03 DIAGNOSIS — E669 Obesity, unspecified: Secondary | ICD-10-CM

## 2015-07-03 NOTE — Progress Notes (Signed)
Pre visit review using our clinic review tool, if applicable. No additional management support is needed unless otherwise documented below in the visit note. 

## 2015-07-03 NOTE — Assessment & Plan Note (Signed)
Deteriorated.  Pt continues to gain weight.  Stressed need for healthy diet and regular exercise.  Check labs to risk stratify.  Will follow. 

## 2015-07-03 NOTE — Assessment & Plan Note (Signed)
Chronic problem.  BP is mildly elevated today.  Pt feels this is due to his recent weight gain.  Reports compliance w/ medications.  Again discussed need for low salt diet, regular exercise.  Check labs.  No anticipated med changes.  Will follow.

## 2015-07-03 NOTE — Patient Instructions (Signed)
Schedule your complete physical in 6 months We'll notify you of your lab results and make any changes if needed Keep up the good work on healthy diet and regular exercise- you can do it!!! Start Zyrtec daily Call with any questions or concerns If you want to join [place] at the new [place] office, any scheduled appointments will automatically transfer and we will see you at [address] [place] [address] [place], [place], [place] [address] ([place]) Have a great weekend!!!

## 2015-07-03 NOTE — Assessment & Plan Note (Signed)
Chronic problem.  Tolerating statin w/o difficulty.  Check labs.  Adjust meds prn  

## 2015-07-03 NOTE — Progress Notes (Signed)
   Subjective:    Patient ID: Gabriel Oconnell, male    DOB: May 28, 1966, 50 y.o.   MRN: [id number]  HPI HTN- chronic problem, on Amlodipine and Valsartan HCTZ.  BP is mildly elevated today.  Pt feels it's b/c he has gained 10 lbs despite exercising 3-5x/week.  Denies CP, SOB, HAs, visual changes, edema.  Hyperlipidemia- chronic problem, on Simvastatin daily.  Denies abd pain, N/V, myalgias.  Weight gain- pt has gained 10 lbs recently despite exercising 3-5x/week.   Review of Systems For ROS see HPI     Objective:   Physical Exam  Constitutional: He is oriented to person, place, and time. He appears well-developed and well-nourished. No distress.  HENT:  Head: Normocephalic and atraumatic.  Eyes: Conjunctivae and EOM are normal. Pupils are equal, round, and reactive to light.  Neck: Normal range of motion. Neck supple. No thyromegaly present.  Cardiovascular: Normal rate, regular rhythm, normal heart sounds and intact distal pulses.   No murmur heard. Pulmonary/Chest: Effort normal and breath sounds normal. No respiratory distress.  Abdominal: Soft. Bowel sounds are normal. He exhibits no distension.  Musculoskeletal: He exhibits no edema.  Lymphadenopathy:    He has no cervical adenopathy.  Neurological: He is alert and oriented to person, place, and time. No cranial nerve deficit.  Skin: Skin is warm and dry.  Psychiatric: He has a normal mood and affect. His behavior is normal.  Vitals reviewed.         Assessment & Plan:

## 2015-07-04 LAB — CBC WITH DIFFERENTIAL/PLATELET
Basophils Absolute: 0 10*3/uL (ref 0.0–0.1)
Basophils Relative: 0.3 % (ref 0.0–3.0)
EOS ABS: 0.2 10*3/uL (ref 0.0–0.7)
Eosinophils Relative: 1.6 % (ref 0.0–5.0)
HCT: 43.4 % (ref 39.0–52.0)
HEMOGLOBIN: 14.5 g/dL (ref 13.0–17.0)
Lymphocytes Relative: 35.3 % (ref 12.0–46.0)
Lymphs Abs: 3.5 10*3/uL (ref 0.7–4.0)
MCHC: 33.4 g/dL (ref 30.0–36.0)
MCV: 96.5 fl (ref 78.0–100.0)
MONO ABS: 0.7 10*3/uL (ref 0.1–1.0)
Monocytes Relative: 7.5 % (ref 3.0–12.0)
Neutro Abs: 5.4 10*3/uL (ref 1.4–7.7)
Neutrophils Relative %: 55.3 % (ref 43.0–77.0)
Platelets: 252 10*3/uL (ref 150.0–400.0)
RBC: 4.49 Mil/uL (ref 4.22–5.81)
RDW: 12.6 % (ref 11.5–15.5)
WBC: 9.8 10*3/uL (ref 4.0–10.5)

## 2015-07-04 LAB — BASIC METABOLIC PANEL
BUN: 14 mg/dL (ref 6–23)
CHLORIDE: 101 meq/L (ref 96–112)
CO2: 30 meq/L (ref 19–32)
Calcium: 10 mg/dL (ref 8.4–10.5)
Creatinine, Ser: 0.92 mg/dL (ref 0.40–1.50)
GFR: 92.57 mL/min (ref >60.00)
GLUCOSE: 93 mg/dL (ref 70–99)
POTASSIUM: 4 meq/L (ref 3.5–5.1)
SODIUM: 139 meq/L (ref 135–145)

## 2015-07-04 LAB — HEPATIC FUNCTION PANEL
ALBUMIN: 4.8 g/dL (ref 3.5–5.2)
ALT: 33 U/L (ref 0–53)
AST: 23 U/L (ref 0–37)
Alkaline Phosphatase: 69 U/L (ref 39–117)
Bilirubin, Direct: 0.2 mg/dL (ref 0.0–0.3)
TOTAL PROTEIN: 7.9 g/dL (ref 6.0–8.3)
Total Bilirubin: 0.7 mg/dL (ref 0.2–1.2)

## 2015-07-04 LAB — TSH: TSH: 0.38 u[IU]/mL (ref 0.35–4.50)

## 2015-07-04 LAB — LIPID PANEL
CHOLESTEROL: 168 mg/dL (ref 0–200)
HDL: 45.5 mg/dL (ref >39.00)
LDL CALC: 93 mg/dL (ref 0–99)
NonHDL: 122.68
TRIGLYCERIDES: 148 mg/dL (ref 0.0–149.0)
Total CHOL/HDL Ratio: 4
VLDL: 29.6 mg/dL (ref 0.0–40.0)

## 2015-07-04 MED ORDER — VALSARTAN-HYDROCHLOROTHIAZIDE 320-12.5 MG PO TABS: 1.0000 | ORAL_TABLET | Freq: Every day | ORAL | Status: DC

## 2015-07-04 MED ORDER — AMLODIPINE BESYLATE 10 MG PO TABS: 10.0000 mg | ORAL_TABLET | Freq: Every day | ORAL | Status: DC

## 2015-07-04 MED ORDER — SIMVASTATIN 20 MG PO TABS: 20.0000 mg | ORAL_TABLET | Freq: Every day | ORAL | Status: DC

## 2015-07-07 ENCOUNTER — Encounter: Payer: Self-pay | Admitting: General Practice

## 2015-07-11 ENCOUNTER — Other Ambulatory Visit: Payer: Self-pay | Admitting: Family Medicine

## 2015-07-11 NOTE — Telephone Encounter (Signed)
Medication filled to pharmacy as requested.   

## 2015-09-03 ENCOUNTER — Telehealth: Payer: Self-pay | Admitting: Family Medicine

## 2015-09-03 MED ORDER — SIMVASTATIN 20 MG PO TABS: 20.0000 mg | ORAL_TABLET | Freq: Every day | ORAL | Status: DC

## 2015-09-03 NOTE — Telephone Encounter (Signed)
Caller name:Lawyer Relationship to patient:self Can be reached:[phone] [facility]   Reason for call:please send 90 day with refills to [facility] Sim[facility]atin

## 2015-09-03 NOTE — Telephone Encounter (Signed)
Medication filled to pharmacy as requested.   

## 2015-09-30 ENCOUNTER — Encounter: Payer: Self-pay | Admitting: Gastroenterology

## 2015-11-25 ENCOUNTER — Telehealth: Payer: Self-pay | Admitting: Behavioral Health

## 2015-11-25 NOTE — Telephone Encounter (Signed)
Unable to reach patient at time of Pre-Visit Call.  Left message for patient to return call when available.    

## 2015-11-26 ENCOUNTER — Encounter: Payer: Self-pay | Admitting: Family Medicine

## 2015-11-26 ENCOUNTER — Ambulatory Visit (INDEPENDENT_AMBULATORY_CARE_PROVIDER_SITE_OTHER): Payer: BLUE CROSS/BLUE SHIELD | Admitting: Family Medicine

## 2015-11-26 ENCOUNTER — Ambulatory Visit (HOSPITAL_BASED_OUTPATIENT_CLINIC_OR_DEPARTMENT_OTHER)
Admission: RE | Admit: 2015-11-26 | Discharge: 2015-11-26 | Disposition: A | Discharge status: Home / Self Care | Payer: BLUE CROSS/BLUE SHIELD | Source: Ambulatory Visit | Attending: Family Medicine | Admitting: Family Medicine

## 2015-11-26 VITALS — BP 138/89 | HR 82 | Temp 97.8°F | Ht 72.0 in | Wt 242.4 lb

## 2015-11-26 DIAGNOSIS — M5137 Other intervertebral disc degeneration, lumbosacral region: Secondary | ICD-10-CM | POA: Insufficient documentation

## 2015-11-26 DIAGNOSIS — M4807 Spinal stenosis, lumbosacral region: Secondary | ICD-10-CM | POA: Diagnosis not present

## 2015-11-26 DIAGNOSIS — I10 Essential (primary) hypertension: Secondary | ICD-10-CM | POA: Diagnosis not present

## 2015-11-26 DIAGNOSIS — M4306 Spondylolysis, lumbar region: Secondary | ICD-10-CM | POA: Diagnosis not present

## 2015-11-26 DIAGNOSIS — M4806 Spinal stenosis, lumbar region: Secondary | ICD-10-CM | POA: Diagnosis not present

## 2015-11-26 DIAGNOSIS — E785 Hyperlipidemia, unspecified: Secondary | ICD-10-CM | POA: Diagnosis not present

## 2015-11-26 DIAGNOSIS — M5136 Other intervertebral disc degeneration, lumbar region: Secondary | ICD-10-CM | POA: Insufficient documentation

## 2015-11-26 DIAGNOSIS — S39012A Strain of muscle, fascia and tendon of lower back, initial encounter: Secondary | ICD-10-CM

## 2015-11-26 DIAGNOSIS — M545 Low back pain: Secondary | ICD-10-CM | POA: Insufficient documentation

## 2015-11-26 MED ORDER — CYCLOBENZAPRINE HCL 10 MG PO TABS: 10.0000 mg | ORAL_TABLET | Freq: Two times a day (BID) | ORAL | Status: DC | PRN

## 2015-11-26 MED ORDER — SIMVASTATIN 20 MG PO TABS: 20.0000 mg | ORAL_TABLET | Freq: Every day | ORAL | Status: DC

## 2015-11-26 MED ORDER — VALSARTAN-HYDROCHLOROTHIAZIDE 320-12.5 MG PO TABS: 1.0000 | ORAL_TABLET | Freq: Every day | ORAL | Status: DC

## 2015-11-26 MED ORDER — AMLODIPINE BESYLATE 10 MG PO TABS: 10.0000 mg | ORAL_TABLET | Freq: Every day | ORAL | Status: DC

## 2015-11-26 NOTE — Progress Notes (Signed)
Pre visit review using our clinic review tool, if applicable. No additional management support is needed unless otherwise documented below in the visit note. 

## 2015-11-26 NOTE — Progress Notes (Signed)
[place] at [facility] [address] [place], [place] [address] [phone]  Date:  11/26/2015   Name:  Gabriel Oconnell   DOB:  07-17-1965   MRN:  [id number]  PCP:  Lamar Blinks, MD    Chief Complaint: Establish Care   History of Present Illness:  Gabriel Oconnell is a 50 y.o. very pleasant male patient who presents with the following:  Here today to establish care with me and to discuss back pain.  Former pt of Dr. Birdie Riddle who has moved to a new office.  He is treated for HTN and also for his cholesterol He will check his BP on occasion- less than 140/90 at home.   Recent labs in February, looked ok  Wt Readings from Last 3 Encounters:  11/26/15 242 lb 6.4 oz (109.952 kg)  07/03/15 244 lb 12.8 oz (111.041 kg)  06/11/15 235 lb (106.595 kg)   He has noted lower back pain- this seems to have gotten worse since he started doing more chores around the house.     He first noted this 2-3 months ago following doing some work on his back patio - he spent a lot of time bend down replacing pavers.  The next day his back was very stiff and painful,. He would have pain when he flexed the right hip  He tried heat, ice alternating which did help.  However the sx came back after he did another session of low to the ground chores and ws bent over He describes a strain feeling in the lower back- this was present mostly in the middle of his back. He would feel stiff when he stood up. Aleve did not help much, icy hot did help some He did not have any numbness or weakness down the right leg He was not having hip flexor pain- the limitation to hip flexion seemed to come from the lower back.  No bowel or bladder dysfunction  Never had any particular back injury in the past  Never had any back imaging  Patient Active Problem List   Diagnosis Date Noted  . Obesity 07/03/2015  . Right foot pain 04/30/2015  . Left foot pain 06/27/2014  . Vestibular  schwannoma ([place]) 12/06/2012  . Ringing in right ear 10/12/2012  . Routine general medical examination at a health care facility 07/07/2012  . Hip pain 07/07/2012  . Family hx of colon cancer 09/29/2010  . DANDRUFF 08/07/2008  . LIPOMA 03/25/2008  . Hyperlipidemia 08/24/2006  . Essential hypertension 08/24/2006    Past Medical History  Diagnosis Date  . Hypertension   . Hyperlipidemia   . Acoustic neuroma [facility])     Past Surgical History  Procedure Laterality Date  . Knee surgery      left    Social History  Substance Use Topics  . Smoking status: Never Smoker   . Smokeless tobacco: Never Used  . Alcohol Use: 1.2 oz/week    2 Standard drinks or equivalent per week     Comment: several drinks per month    Family History  Problem Relation Age of Onset  . Hypertension    . Pancreatic cancer    . Pancreatic cancer Father   . Colon polyps Father   . Colon cancer Father   . Colon cancer Paternal Grandmother     No Known Allergies  Medication list has been reviewed and updated.  Current Outpatient Prescriptions on File Prior to Visit  Medication  Sig Dispense Refill  . amLODipine (NORVASC) 10 MG tablet TAKE 1 TABLET DAILY 90 tablet 1  . aspirin (BAYER LOW DOSE) 81 MG chewable tablet Chew 81 mg by mouth daily.    Mar[place] Kitchen glucosamine-chondroitin 500-400 MG tablet Take 1 tablet by mouth daily.      . Multiple Vitamins-Minerals (MULTIVITAMIN,TX-MINERALS) tablet Take 1 tablet by mouth daily.      . simvastatin (ZOCOR) 20 MG tablet Take 1 tablet (20 mg total) by mouth daily. 90 tablet 1  . valsartan-hydrochlorothiazide (DIOVAN-HCT) 320-12.5 MG tablet TAKE 1 TABLET DAILY 90 tablet 1   No current facility-administered medications on file prior to visit.    Review of Systems:  As per HPI- otherwise negative.   Physical Examination: Filed Vitals:   11/26/15 1527  BP: 138/89  Pulse: 82  Temp: 97.8 F (36.6 C)   Filed Vitals:   11/26/15 1527  Height: 6' (1.829 m)   Weight: 242 lb 6.4 oz (109.952 kg)   Body mass index is 32.87 kg/(m^2). Ideal Body Weight: Weight in (lb) to have BMI = 25: 183.9  GEN: WDWN, NAD, Non-toxic, A & O x 3, overweight, looks well HEENT: Atraumatic, Normocephalic. Neck supple. No masses, No LAD. Ears and Nose: No external deformity. CV: RRR, No M/G/R. No JVD. No thrill. No extra heart sounds. PULM: CTA B, no wheezes, crackles, rhonchi. No retractions. No resp. distress. No accessory muscle use. EXTR: No c/c/e NEURO Normal gait.  PSYCH: Normally interactive. Conversant. Not depressed or anxious appearing.  Calm demeanor.  Lumbar spine: normal flexion and extension. Normal LE strength, sensation and DTR, negative SLR bilaterlly  Dg Lumbar Spine Complete  11/26/2015  CLINICAL DATA:  Several month history of lumbar spine pain centered at L4-5 ; no known injury EXAM: LUMBAR SPINE - COMPLETE 4+ VIEW COMPARISON:  None in PACs FINDINGS: The lumbar vertebral bodies are preserved in height. There is mild disc space narrowing at L4-5 and at L5-S1. There is no spondylolisthesis. However, a pars defect at L5 on the right may be present. The pedicles and transverse processes are intact. The observed portions of the sacrum are normal. IMPRESSION: 1. Degenerative disc space narrowing at L4-5 and L5-S1. 2. Possible pars defect likely on the right at L5. No spondylolisthesis. CT scan of the lumbar spine would be a useful next imaging step to better evaluate the pars interarticularis at L5. Electronically Signed   By: David  Martinique M.D.   On: 11/26/2015 16:54   Assessment and Plan: Lumbar strain, initial encounter - Plan: DG Lumbar Spine Complete, cyclobenzaprine (FLEXERIL) 10 MG tablet, CT LUMBAR SPINE WO CONTRAST  Hyperlipidemia - Plan: simvastatin (ZOCOR) 20 MG tablet  Essential hypertension - Plan: amLODipine (NORVASC) 10 MG tablet, valsartan-hydrochlorothiazide (DIOVAN-HCT) 320-12.5 MG tablet  Pars defect of lumbar spine - Plan: CT LUMBAR  SPINE WO CONTRAST  Here today to establish care and discuss back pain refilled cholesterol and BP medications Discussed back pain- he will probably have pain again if he does a lot of work while bent low to the ground.  Would spread this sort of task over a few days. Flexeril to have on hand for sx, cautioned about sedation  Received plain film results as above- called pt and left detailed message.  Will order CT to help [place] determine if new or old pars defect  Signed Lamar Blinks, MD

## 2015-11-26 NOTE — Patient Instructions (Addendum)
We will get some x-rays for you today and I will send the results to you asap If you have back pain again, you can take the flexeril as needed If your back pains are worsening or you have any other symptoms

## 2015-12-03 ENCOUNTER — Telehealth: Payer: Self-pay | Admitting: Family Medicine

## 2015-12-03 NOTE — Telephone Encounter (Signed)
Relation to [id number] Call back number:[phone]:  Reason for call:  Patient received a detail message from PCP regarding imaging results and would like to discuss. Please advise

## 2015-12-04 NOTE — Telephone Encounter (Signed)
Called but no answer.  LMOM- I sent him a mychart so he can reply with any questions

## 2015-12-08 ENCOUNTER — Ambulatory Visit (HOSPITAL_BASED_OUTPATIENT_CLINIC_OR_DEPARTMENT_OTHER): Payer: BLUE CROSS/BLUE SHIELD

## 2016-02-16 ENCOUNTER — Encounter: Payer: Self-pay | Admitting: Gastroenterology

## 2016-04-05 ENCOUNTER — Ambulatory Visit (AMBULATORY_SURGERY_CENTER): Payer: Self-pay | Admitting: *Deleted

## 2016-04-05 VITALS — Ht 72.0 in | Wt 238.0 lb

## 2016-04-05 DIAGNOSIS — Z800 Family history of malignant neoplasm of digestive organs: Secondary | ICD-10-CM

## 2016-04-05 MED ORDER — NA SULFATE-K SULFATE-MG SULF 17.5-3.13-1.6 GM/177ML PO SOLN: ORAL | 0 refills | Status: DC

## 2016-04-05 NOTE — Progress Notes (Signed)
Patient denies any allergies to eggs or soy. Patient denies any problems with anesthesia/sedation. Patient denies any oxygen use at home and does not take any diet/weight loss medications.  

## 2016-04-06 ENCOUNTER — Encounter: Payer: Self-pay | Admitting: Gastroenterology

## 2016-04-19 ENCOUNTER — Ambulatory Visit (AMBULATORY_SURGERY_CENTER): Payer: BLUE CROSS/BLUE SHIELD | Admitting: Gastroenterology

## 2016-04-19 ENCOUNTER — Encounter: Payer: Self-pay | Admitting: Gastroenterology

## 2016-04-19 VITALS — BP 124/98 | HR 64 | Temp 97.3°F | Resp 14 | Ht 72.0 in | Wt 238.0 lb

## 2016-04-19 DIAGNOSIS — D122 Benign neoplasm of ascending colon: Secondary | ICD-10-CM | POA: Diagnosis not present

## 2016-04-19 DIAGNOSIS — Z800 Family history of malignant neoplasm of digestive organs: Secondary | ICD-10-CM

## 2016-04-19 DIAGNOSIS — Z1211 Encounter for screening for malignant neoplasm of colon: Secondary | ICD-10-CM | POA: Diagnosis not present

## 2016-04-19 DIAGNOSIS — Z1212 Encounter for screening for malignant neoplasm of rectum: Secondary | ICD-10-CM | POA: Diagnosis not present

## 2016-04-19 MED ORDER — SODIUM CHLORIDE 0.9 % IV SOLN: 500.0000 mL | INTRAVENOUS | Status: DC

## 2016-04-19 NOTE — Progress Notes (Signed)
Report given to PACU RN, vss 

## 2016-04-19 NOTE — Progress Notes (Signed)
Called to room to assist during endoscopic procedure.  Patient ID and intended procedure confirmed with present staff. Received instructions for my participation in the procedure from the performing physician.  

## 2016-04-19 NOTE — Patient Instructions (Signed)
Discharge instructions given. Handout on polyps. Resume previous medications. YOU HAD AN ENDOSCOPIC PROCEDURE TODAY AT THE [facility]:   Refer to the procedure report that was given to you for any specific questions about what was found during the examination.  If the procedure report does not answer your questions, please call your gastroenterologist to clarify.  If you requested that your care partner not be given the details of your procedure findings, then the procedure report has been included in a sealed envelope for you to review at your convenience later.  YOU SHOULD EXPECT: Some feelings of bloating in the abdomen. Passage of more gas than usual.  Walking can help get rid of the air that was put into your GI tract during the procedure and reduce the bloating. If you had a lower endoscopy (such as a colonoscopy or flexible sigmoidoscopy) you may notice spotting of blood in your stool or on the toilet paper. If you underwent a bowel prep for your procedure, you may not have a normal bowel movement for a few days.  Please Note:  You might notice some irritation and congestion in your nose or some drainage.  This is from the oxygen used during your procedure.  There is no need for concern and it should clear up in a day or so.  SYMPTOMS TO REPORT IMMEDIATELY:   Following lower endoscopy (colonoscopy or flexible sigmoidoscopy):  Excessive amounts of blood in the stool  Significant tenderness or worsening of abdominal pains  Swelling of the abdomen that is new, acute  Fever of 100F or higher   For urgent or emergent issues, a gastroenterologist can be reached at any hour by calling [phone].   DIET:  We do recommend a small meal at first, but then you may proceed to your regular diet.  Drink plenty of fluids but you should avoid alcoholic beverages for 24 hours.  ACTIVITY:  You should plan to take it easy for the rest of today and you should NOT DRIVE or use heavy  machinery until tomorrow (because of the sedation medicines used during the test).    FOLLOW UP: Our staff will call the number listed on your records the next business day following your procedure to check on you and address any questions or concerns that you may have regarding the information given to you following your procedure. If we do not reach you, we will leave a message.  However, if you are feeling well and you are not experiencing any problems, there is no need to return our call.  We will assume that you have returned to your regular daily activities without incident.  If any biopsies were taken you will be contacted by phone or by letter within the next 1-3 weeks.  Please call us at [phone] if you have not heard about the biopsies in 3 weeks.    SIGNATURES/CONFIDENTIALITY: You and/or your care partner have signed paperwork which will be entered into your electronic medical record.  These signatures attest to the fact that that the information above on your After Visit Summary has been reviewed and is understood.  Full responsibility of the confidentiality of this discharge information lies with you and/or your care-partner. 

## 2016-04-19 NOTE — Op Note (Signed)
[place] Patient Name: Gabriel Oconnell Procedure Date: 04/19/2016 8:30 AM MRN: [id number] Endoscopist: Milus Banister , MD Age: 50 Referring MD:  Date of Birth: 1965-11-10 Gender: Male Account #: [id number] Procedure:                Colonoscopy Indications:              Screening in patient at increased risk: Family                            history of 1st-degree relative with colorectal                            cancer before age 85 years (mother and grandparent) Medicines:                Monitored Anesthesia Care Procedure:                Pre-Anesthesia Assessment:                           - Prior to the procedure, a History and Physical                            was performed, and patient medications and                            allergies were reviewed. The patient's tolerance of                            previous anesthesia was also reviewed. The risks                            and benefits of the procedure and the sedation                            options and risks were discussed with the patient.                            All questions were answered, and informed consent                            was obtained. Prior Anticoagulants: The patient has                            taken no previous anticoagulant or antiplatelet                            agents. ASA Grade Assessment: II - A patient with                            mild systemic disease. After reviewing the risks                            and benefits, the patient was deemed in  satisfactory condition to undergo the procedure.                           After obtaining informed consent, the colonoscope                            was passed under direct vision. Throughout the                            procedure, the patient's blood pressure, pulse, and                            oxygen saturations were monitored continuously. The                            Model  [id number]-[id number]) scope was introduced                            through the anus and advanced to the the cecum,                            identified by appendiceal orifice and ileocecal                            valve. The colonoscopy was performed without                            difficulty. The patient tolerated the procedure                            well. The quality of the bowel preparation was                            excellent. The ileocecal valve, appendiceal                            orifice, and rectum were photographed. Scope In: 8:39:21 AM Scope Out: 8:48:36 AM Scope Withdrawal Time: 0 hours 6 minutes 40 seconds  Total Procedure Duration: 0 hours 9 minutes 15 seconds  Findings:                 A 2 mm polyp was found in the ascending colon. The                            polyp was sessile. The polyp was removed with a                            cold biopsy forceps. Resection and retrieval were                            complete.                           The exam was otherwise without abnormality on  direct and retroflexion views. Complications:            No immediate complications. Estimated blood loss:                            None. Estimated Blood Loss:     Estimated blood loss: none. Impression:               - One 2 mm polyp in the ascending colon, removed                            with a cold biopsy forceps. Resected and retrieved.                           - The examination was otherwise normal on direct                            and retroflexion views. Recommendation:           - Patient has a contact number available for                            emergencies. The signs and symptoms of potential                            delayed complications were discussed with the                            patient. Return to normal activities tomorrow.                            Written discharge instructions were provided to the                             patient.                           - Resume previous diet.                           - Continue present medications.                           You will receive a letter within 2-3 weeks with the                            pathology results and my final recommendations.                           If the polyp(s) is proven to be 'pre-cancerous' on                            pathology, you will need repeat colonoscopy in 5                            years. Milus Banister, MD 04/19/2016 8:51:29  AM This report has been signed electronically.

## 2016-04-20 ENCOUNTER — Telehealth: Payer: Self-pay

## 2016-04-20 NOTE — Telephone Encounter (Signed)
Left a message at [phone] for the pt to call us back if any questions or concerns. aw

## 2016-04-20 NOTE — Telephone Encounter (Signed)
  Follow up Call-  Call back number 04/19/2016  Post procedure Call Back phone  # [phone]  Permission to leave phone message Yes  Some recent data might be hidden     Patient questions:  Do you have a fever, pain , or abdominal swelling? No. Pain Score  0 *  Have you tolerated food without any problems? Yes.    Have you been able to return to your normal activities? Yes.    Do you have any questions about your discharge instructions: Diet   No. Medications  No. Follow up visit  No.  Do you have questions or concerns about your Care? No.  Actions: * If pain score is 4 or above: No action needed, pain <4.

## 2016-04-26 ENCOUNTER — Encounter: Payer: Self-pay | Admitting: Gastroenterology

## 2016-06-21 ENCOUNTER — Ambulatory Visit (INDEPENDENT_AMBULATORY_CARE_PROVIDER_SITE_OTHER): Payer: BLUE CROSS/BLUE SHIELD | Admitting: Family Medicine

## 2016-06-21 ENCOUNTER — Encounter: Payer: Self-pay | Admitting: Family Medicine

## 2016-06-21 VITALS — BP 118/82 | HR 93 | Temp 97.8°F | Ht 72.0 in | Wt 240.6 lb

## 2016-06-21 DIAGNOSIS — Z130 Encounter for screening for diseases of the blood and blood-forming organs and certain disorders involving the immune mechanism: Secondary | ICD-10-CM | POA: Diagnosis not present

## 2016-06-21 DIAGNOSIS — E782 Mixed hyperlipidemia: Secondary | ICD-10-CM | POA: Diagnosis not present

## 2016-06-21 DIAGNOSIS — L237 Allergic contact dermatitis due to plants, except food: Secondary | ICD-10-CM

## 2016-06-21 DIAGNOSIS — Z0000 Encounter for general adult medical examination without abnormal findings: Secondary | ICD-10-CM

## 2016-06-21 DIAGNOSIS — Z131 Encounter for screening for diabetes mellitus: Secondary | ICD-10-CM

## 2016-06-21 DIAGNOSIS — E663 Overweight: Secondary | ICD-10-CM

## 2016-06-21 DIAGNOSIS — I10 Essential (primary) hypertension: Secondary | ICD-10-CM | POA: Diagnosis not present

## 2016-06-21 LAB — COMPREHENSIVE METABOLIC PANEL
ALT: 24 U/L (ref 0–53)
AST: 18 U/L (ref 0–37)
Albumin: 4.6 g/dL (ref 3.5–5.2)
Alkaline Phosphatase: 59 U/L (ref 39–117)
BUN: 12 mg/dL (ref 6–23)
CO2: 29 meq/L (ref 19–32)
Calcium: 9.8 mg/dL (ref 8.4–10.5)
Chloride: 103 mEq/L (ref 96–112)
Creatinine, Ser: 0.98 mg/dL (ref 0.40–1.50)
GFR: 85.73 mL/min (ref >60.00)
GLUCOSE: 101 mg/dL — AB (ref 70–99)
POTASSIUM: 4.1 meq/L (ref 3.5–5.1)
SODIUM: 139 meq/L (ref 135–145)
Total Bilirubin: 0.7 mg/dL (ref 0.2–1.2)
Total Protein: 7.8 g/dL (ref 6.0–8.3)

## 2016-06-21 LAB — CBC
HEMATOCRIT: 43.3 % (ref 39.0–52.0)
HEMOGLOBIN: 14.9 g/dL (ref 13.0–17.0)
MCHC: 34.5 g/dL (ref 30.0–36.0)
MCV: 94.2 fl (ref 78.0–100.0)
PLATELETS: 267 10*3/uL (ref 150.0–400.0)
RBC: 4.6 Mil/uL (ref 4.22–5.81)
RDW: 13.2 % (ref 11.5–15.5)
WBC: 7.5 10*3/uL (ref 4.0–10.5)

## 2016-06-21 LAB — LIPID PANEL
CHOL/HDL RATIO: 3
Cholesterol: 158 mg/dL (ref 0–200)
HDL: 46.9 mg/dL (ref >39.00)
LDL Cholesterol: 89 mg/dL (ref 0–99)
NONHDL: 110.65
Triglycerides: 107 mg/dL (ref 0.0–149.0)
VLDL: 21.4 mg/dL (ref 0.0–40.0)

## 2016-06-21 LAB — HEMOGLOBIN A1C: HEMOGLOBIN A1C: 5.8 % (ref 4.6–6.5)

## 2016-06-21 MED ORDER — TRIAMCINOLONE ACETONIDE 0.5 % EX OINT: 1.0000 "application " | TOPICAL_OINTMENT | Freq: Two times a day (BID) | CUTANEOUS | 0 refills | Status: DC

## 2016-06-21 NOTE — Progress Notes (Signed)
Pre visit review using our clinic review tool, if applicable. No additional management support is needed unless otherwise documented below in the visit note. 

## 2016-06-21 NOTE — Patient Instructions (Signed)
It was great to see you today We will get labs today and I will be in touch with your results asap Please continue to keep up with your colonoscopy screening as directed  Work on gradual weigh loss- even 10 -15 lbs can make a big difference in your health.  Aim to lose 1/2 to 1 lb a week.  There are several free apps for your phone that will help you work towards this goal.  [place] job with increasing exercise; try to work exercise into your day as much as you can. A short walk a couple of times a day can really help  Please review the info I gave you about PSA testing.  If you decide you would like this test I can order it for you, or you can have it done next year

## 2016-06-21 NOTE — Progress Notes (Signed)
[place] at [facility] [address] [place], [place] [address] [phone]  Date:  06/21/2016   Name:  Gabriel Oconnell   DOB:  Jan 24, 1966   MRN:  [id number]  PCP:  Lamar Blinks, MD    Chief Complaint: No chief complaint on file.   History of Present Illness:  Gabriel Oconnell is a 51 y.o. very pleasant male patient who presents with the following:  Here today for a CPE History of obesity, HTN, hyperlipidemia Colonoscopy last year Tdap is UTD Flu shot:   Last labs about one year ago Never a smoker Drinks alcohol a few times a week- socially  He works in a sedentary job Scientist, research (life sciences).  He would like to get more exercise- he plans to work on this.  He is trying to exercise some at home   His back injury from the summer is now resolved.  He is not having to use his flexeril any longer.  He tries to be a bit more careful about how he uses his back and has avoided aggravating it again  He has noted a little patch of PI on his right ventral wrist that has been present since the summer  He has not noted any SOB or CP while he is exercising.    Patient Active Problem List   Diagnosis Date Noted  . Obesity 07/03/2015  . Right foot pain 04/30/2015  . Left foot pain 06/27/2014  . Vestibular schwannoma ([place]) 12/06/2012  . Ringing in right ear 10/12/2012  . Routine general medical examination at a health care facility 07/07/2012  . Hip pain 07/07/2012  . Family hx of colon cancer 09/29/2010  . DANDRUFF 08/07/2008  . LIPOMA 03/25/2008  . Hyperlipidemia 08/24/2006  . Essential hypertension 08/24/2006    Past Medical History:  Diagnosis Date  . Acoustic neuroma ([place])   . Hyperlipidemia   . Hypertension     Past Surgical History:  Procedure Laterality Date  . KNEE SURGERY     left    Social History  Substance Use Topics  . Smoking status: Never Smoker  . Smokeless tobacco: Never Used  . Alcohol use 1.2 oz/week     2 Standard drinks or equivalent per week     Comment: 2-3 drinks per week per pt    Family History  Problem Relation Age of Onset  . Pancreatic cancer Father   . Colon polyps Father   . Colon cancer Father     late 67's  . Colon cancer Paternal Grandmother     early 64's  . Heart disease Mother     No Known Allergies  Medication list has been reviewed and updated.  Current Outpatient Prescriptions on File Prior to Visit  Medication Sig Dispense Refill  . amLODipine (NORVASC) 10 MG tablet Take 1 tablet (10 mg total) by mouth daily. 90 tablet 3  . aspirin (BAYER LOW DOSE) 81 MG chewable tablet Chew 81 mg by mouth daily.    . cyclobenzaprine (FLEXERIL) 10 MG tablet Take 1 tablet (10 mg total) by mouth 2 (two) times daily as needed for muscle spasms. (Patient not taking: Reported on 04/19/2016) 30 tablet 0  . glucosamine-chondroitin 500-400 MG tablet Take 1 tablet by mouth daily.      . Multiple Vitamins-Minerals (MULTIVITAMIN,TX-MINERALS) tablet Take 1 tablet by mouth daily.      . simvastatin (ZOCOR) 20 MG tablet Take 1 tablet (20 mg total) by mouth daily. 90 tablet  3  . valsartan-hydrochlorothiazide (DIOVAN-HCT) 320-12.5 MG tablet Take 1 tablet by mouth daily. 90 tablet 3   Current Facility-Administered Medications on File Prior to Visit  Medication Dose Route Frequency Provider Last Rate Last Dose  . 0.9 %  sodium chloride infusion  500 mL Intravenous Continuous Milus Banister, MD        Review of Systems:  As per HPI- otherwise negative. Weight is stable   Physical Examination: Blood pressure 118/82, pulse 93, temperature 97.8 F (36.6 C), temperature source Oral, height 6' (1.829 m), weight 240 lb 9.6 oz (109.1 kg), SpO2 98 %.  Wt Readings from Last 3 Encounters:  06/21/16 240 lb 9.6 oz (109.1 kg)  04/19/16 238 lb (108 kg)  04/05/16 238 lb (108 kg)    GEN: WDWN, NAD, Non-toxic, A & O x 3,  Overweight, looks well HEENT: Atraumatic, Normocephalic. Neck supple.  No masses, No LAD.  Bilateral TM wnl, oropharynx normal.  PEERL,EOMI.   Ears and Nose: No external deformity. CV: RRR, No M/G/R. No JVD. No thrill. No extra heart sounds. PULM: CTA B, no wheezes, crackles, rhonchi. No retractions. No resp. distress. No accessory muscle use. ABD: S, NT, ND. No rebound. No HSM. EXTR: No c/c/e NEURO Normal gait.  PSYCH: Normally interactive. Conversant. Not depressed or anxious appearing.  Calm demeanor.  Right wrist : on the ventral surface is a cluster of papules, some excoriated.    Assessment and Plan: Physical exam  Mixed hyperlipidemia - Plan: Lipid panel  Essential hypertension, benign - Plan: Comprehensive metabolic panel  Screening for diabetes mellitus - Plan: Hemoglobin A1c  Screening for deficiency anemia - Plan: CBC  Poison ivy - Plan: triamcinolone ointment (KENALOG) 0.5 %  Overweight  Here today for a CPE He is complaint with his BP and cholesterol meds BP looks fine- discussed that if he loses weight may be able to stop one of his BP meds.  He would like to work towards this Discussed PSA testing - he defers for now, but gave him a hand out to read more about this service Triamcinolone ointment for the rash on his wrist- encouraged him to try not to scratch, let me know if this does not resolve  Signed Lamar Blinks, MD

## 2016-09-23 LAB — PSA: PSA: 1.1

## 2016-09-23 LAB — HEPATIC FUNCTION PANEL
ALT: 25 U/L (ref 10–40)
AST: 19 U/L (ref 14–40)
Alkaline Phosphatase: 62 U/L (ref 25–125)
Bilirubin, Total: 0.6 mg/dL

## 2016-09-23 LAB — BASIC METABOLIC PANEL
BUN: 12 mg/dL (ref 4–21)
CREATININE: 0.9 mg/dL (ref 0.6–1.3)
Glucose: 90 mg/dL
POTASSIUM: 4 mmol/L (ref 3.4–5.3)
Sodium: 141 mmol/L (ref 137–147)

## 2016-09-23 LAB — LIPID PANEL
CHOLESTEROL: 163 mg/dL (ref 0–200)
HDL: 48 mg/dL (ref 35–70)
LDL Cholesterol: 89 mg/dL
Triglycerides: 132 mg/dL (ref 40–160)

## 2016-09-23 LAB — CBC AND DIFFERENTIAL
HCT: 41 % (ref 41–53)
Hemoglobin: 14.2 g/dL (ref 13.5–17.5)
Platelets: 259 10*3/uL (ref 150–399)
WBC: 6.4 10*3/mL

## 2016-10-20 ENCOUNTER — Encounter: Payer: Self-pay | Admitting: Family Medicine

## 2016-11-20 ENCOUNTER — Other Ambulatory Visit: Payer: Self-pay | Admitting: Family Medicine

## 2016-11-20 DIAGNOSIS — E785 Hyperlipidemia, unspecified: Secondary | ICD-10-CM

## 2016-11-22 ENCOUNTER — Other Ambulatory Visit: Payer: Self-pay | Admitting: Emergency Medicine

## 2016-11-24 ENCOUNTER — Other Ambulatory Visit: Payer: Self-pay | Admitting: Family Medicine

## 2016-11-24 DIAGNOSIS — I10 Essential (primary) hypertension: Secondary | ICD-10-CM

## 2017-02-02 ENCOUNTER — Other Ambulatory Visit: Payer: Self-pay | Admitting: Otolaryngology

## 2017-02-02 DIAGNOSIS — D333 Benign neoplasm of cranial nerves: Secondary | ICD-10-CM

## 2017-02-15 ENCOUNTER — Ambulatory Visit
Admission: RE | Admit: 2017-02-15 | Discharge: 2017-02-15 | Disposition: A | Discharge status: Home / Self Care | Payer: BLUE CROSS/BLUE SHIELD | Source: Ambulatory Visit | Attending: Otolaryngology | Admitting: Otolaryngology

## 2017-02-15 DIAGNOSIS — D333 Benign neoplasm of cranial nerves: Secondary | ICD-10-CM | POA: Diagnosis not present

## 2017-02-15 MED ORDER — GADOBENATE DIMEGLUMINE 529 MG/ML IV SOLN
20.0000 mL | Freq: Once | INTRAVENOUS | Status: AC | PRN
  Administered 2017-02-15: 20 mL via INTRAVENOUS

## 2017-03-03 DIAGNOSIS — D333 Benign neoplasm of cranial nerves: Secondary | ICD-10-CM | POA: Diagnosis not present

## 2017-03-03 DIAGNOSIS — H903 Sensorineural hearing loss, bilateral: Secondary | ICD-10-CM | POA: Diagnosis not present

## 2017-03-14 DIAGNOSIS — H40033 Anatomical narrow angle, bilateral: Secondary | ICD-10-CM | POA: Diagnosis not present

## 2017-03-14 DIAGNOSIS — H04123 Dry eye syndrome of bilateral lacrimal glands: Secondary | ICD-10-CM | POA: Diagnosis not present

## 2017-06-22 NOTE — Progress Notes (Addendum)
[facility] [address], [place], [place] [address] [phone]  Date:  06/23/2017   Name:  Gabriel Oconnell   DOB:  01/10/1966   MRN:  [id number]  PCP:  Darreld Mclean, MD    Chief Complaint: Annual Exam (Pt here for CPE with fasting labs. )   History of Present Illness:  Gabriel Oconnell is a 52 y.o. very pleasant male patient who presents with the following:  Here today for a CPE History of obesity, hyperlipidemia, HTN Last visit with me just about one year ago: Last labs about one year ago Never a smoker Drinks alcohol a few times a week- socially He works in a sedentary job Scientist, research (life sciences).  He would like to get more exercise- he plans to work on this.  He is trying to exercise some at home Colon: 2017 Flu:10/18 Labs: one year ago Tdap: 2015  He continues to use amlodipine and diovan HCT for HTN, zocor for lipids He notes that he is overall doing well He feels like his nasal passages are irritated and clogged.  He did move into a new home recently, and sx seemed to flare up over the last few months.  He does have some sneezing.  Not really runny nose.  Nose is not itchy. His eyes are ok Sometimes in the spring he will try allegra- he did try this for a week but it did not help.   He did put a better air filter in the house, this seemed to help some He is currently using afrin, and this is really helping.  He has been on this for about one month now! He is generally using this in the evening and at night, and it does help No cough or fever  He is fasting today for labs  His family built a house last year- between moving and everything else, he has been eating fast food too much and not exercising as much as he should- he hopes to do better  He has also noted a recurrent, persistent rash on his right wrist- we tried triamcinolone for his last year.  This did help but it continues to come back, and he has  been treating it intermittently with topical steroid.  No other spots on his skin   He does not have any sx of prostate enlargement or any genital concerns.  Will check PSA for him today  Wt Readings from Last 3 Encounters:  06/23/17 244 lb 6.4 oz (110.9 kg)  06/21/16 240 lb 9.6 oz (109.1 kg)  04/19/16 238 lb (108 kg)   BP Readings from Last 3 Encounters:  06/23/17 (!) 142/84  06/21/16 118/82  04/19/16 (!) 124/98    Patient Active Problem List   Diagnosis Date Noted  . Obesity 07/03/2015  . Right foot pain 04/30/2015  . Left foot pain 06/27/2014  . Vestibular schwannoma ([facility]) 12/06/2012  . Ringing in right ear 10/12/2012  . Hip pain 07/07/2012  . Family hx of colon cancer 09/29/2010  . DANDRUFF 08/07/2008  . LIPOMA 03/25/2008  . Hyperlipidemia 08/24/2006  . Essential hypertension 08/24/2006    Past Medical History:  Diagnosis Date  . Acoustic neuroma ([place])   . Hyperlipidemia   . Hypertension     Past Surgical History:  Procedure Laterality Date  . KNEE SURGERY     left    Social History   Tobacco Use  . Smoking status: Never Smoker  .  Smokeless tobacco: Never Used  Substance Use Topics  . Alcohol use: Yes    Alcohol/week: 1.2 oz    Types: 2 Standard drinks or equivalent per week    Comment: 2-3 drinks per week per pt  . Drug use: No    Family History  Problem Relation Age of Onset  . Pancreatic cancer Father   . Colon polyps Father   . Colon cancer Father        late 45's  . Colon cancer Paternal Grandmother        early 28's  . Heart disease Mother     No Known Allergies  Medication list has been reviewed and updated.  Current Outpatient Medications on File Prior to Visit  Medication Sig Dispense Refill  . amLODipine (NORVASC) 10 MG tablet TAKE 1 TABLET DAILY 90 tablet 3  . aspirin (BAYER LOW DOSE) 81 MG chewable tablet Chew 81 mg by mouth daily.    . cyclobenzaprine (FLEXERIL) 10 MG tablet Take 1 tablet (10 mg total) by mouth 2 (two) times  daily as needed for muscle spasms. 30 tablet 0  . glucosamine-chondroitin 500-400 MG tablet Take 1 tablet by mouth daily.      . Multiple Vitamins-Minerals (MULTIVITAMIN,TX-MINERALS) tablet Take 1 tablet by mouth daily.      . simvastatin (ZOCOR) 20 MG tablet TAKE 1 TABLET DAILY 90 tablet 3  . triamcinolone ointment (KENALOG) 0.5 % Apply 1 application topically 2 (two) times daily. Use for up to 2 weeks for rash 15 g 0  . valsartan-hydrochlorothiazide (DIOVAN-HCT) 320-12.5 MG tablet TAKE 1 TABLET DAILY 90 tablet 3   No current facility-administered medications on file prior to visit.     Review of Systems:  As per HPI- otherwise negative.   Physical Examination: Vitals:   06/23/17 0840  BP: (!) 142/84  Pulse: 98  SpO2: 98%   Vitals:   06/23/17 0840  Weight: 244 lb 6.4 oz (110.9 kg)  Height: 6\' 1"  (1.854 m)   Body mass index is 32.24 kg/m. Ideal Body Weight: Weight in (lb) to have BMI = 25: 189.1  GEN: WDWN, NAD, Non-toxic, A & O x 3 HEENT: Atraumatic, Normocephalic. Neck supple. No masses, No LAD.  Bilateral TM wnl, oropharynx normal.  PEERL,EOMI.   Nasal cavity is inflamed, esp on the left side  Ears and Nose: No external deformity. CV: RRR, No M/G/R. No JVD. No thrill. No extra heart sounds. PULM: CTA B, no wheezes, crackles, rhonchi. No retractions. No resp. distress. No accessory muscle use. ABD: S, NT, ND, +BS. No rebound. No HSM. EXTR: No c/c/e NEURO Normal gait.  PSYCH: Normally interactive. Conversant. Not depressed or anxious appearing.  Calm demeanor.  Right dorsal wrist displays a small cluster or papules, dark flesh colored.  Do not appear c/w any type of skin cancer. May be due to chronic irritation   Assessment and Plan: Physical exam  Mixed hyperlipidemia - Plan: Lipid panel  Screening for diabetes mellitus - Plan: Comprehensive metabolic panel, Hemoglobin A1c  Essential hypertension, benign - Plan: CBC, Comprehensive metabolic panel  Screening for  prostate cancer - Plan: PSA  Non-seasonal allergic rhinitis due to other allergic trigger  Dermatitis - Plan: Fluocinonide 0.1 % CREA  Here today for a CPE Labs pending as above Discussed his likely nasal allergies, and overuse of afrin. Gave him a sample of nasacort to use and instructed to taper off afrin- he will try to do so Will plan further follow- up pending labs. Encouraged  exercise and healthy eating.  He will try fluicinonide cream on his wrist- however if not resolved he will contact me for derm referral   Signed Lamar Blinks, MD  Received his labs  Your blood count is normal Metabolic profile is normal Cholesterol looks good- continue simvastatin Your A1c (average blood sugar) is just barely in the pre-diabetes range.  Losing a few pounds, exercise will help prevent development of diabetes later on  Your PSA looks fine- it is stable from years past which is always reassuring Lab Results      Component                Value               Date                      PSA                      1.18                06/23/2017                PSA                      1.1                 09/23/2016                PSA                      1.04                12/25/2014            Take care and please see me in about one year Results for orders placed or performed in visit on 06/23/17  CBC  Result Value Ref Range   WBC 6.5 4.0 - 10.5 K/uL   RBC 4.33 4.22 - 5.81 Mil/uL   Platelets 259.0 150.0 - 400.0 K/uL   Hemoglobin 14.3 13.0 - 17.0 g/dL   HCT 41.6 39.0 - 52.0 %   MCV 96.1 78.0 - 100.0 fl   MCHC 34.5 30.0 - 36.0 g/dL   RDW 13.2 11.5 - 15.5 %  Comprehensive metabolic panel  Result Value Ref Range   Sodium 143 135 - 145 mEq/L   Potassium 4.6 3.5 - 5.1 mEq/L   Chloride 105 96 - 112 mEq/L   CO2 31 19 - 32 mEq/L   Glucose, Bld 102 (H) 70 - 99 mg/dL   BUN 15 6 - 23 mg/dL   Creatinine, Ser 0.89 0.40 - 1.50 mg/dL   Total Bilirubin 0.7 0.2 - 1.2 mg/dL   Alkaline  Phosphatase 68 39 - 117 U/L   AST 18 0 - 37 U/L   ALT 23 0 - 53 U/L   Total Protein 7.4 6.0 - 8.3 g/dL   Albumin 4.8 3.5 - 5.2 g/dL   Calcium 9.6 8.4 - 10.5 mg/dL   GFR 95.43 >60.00 mL/min  Hemoglobin A1c  Result Value Ref Range   Hgb A1c MFr Bld 5.8 4.6 - 6.5 %  Lipid panel  Result Value Ref Range   Cholesterol 141 0 - 200 mg/dL   Triglycerides 75.0 0.0 - 149.0 mg/dL   HDL 47.00 >39.00 mg/dL   VLDL 15.0 0.0 - 40.0 mg/dL   LDL Cholesterol 79 0 - 99 mg/dL  Total CHOL/HDL Ratio 3    NonHDL 94.02   PSA  Result Value Ref Range   PSA 1.18 0.10 - 4.00 ng/mL

## 2017-06-23 ENCOUNTER — Encounter: Payer: Self-pay | Admitting: Family Medicine

## 2017-06-23 ENCOUNTER — Ambulatory Visit (INDEPENDENT_AMBULATORY_CARE_PROVIDER_SITE_OTHER): Payer: BLUE CROSS/BLUE SHIELD | Admitting: Family Medicine

## 2017-06-23 VITALS — BP 142/84 | HR 98 | Ht 73.0 in | Wt 244.4 lb

## 2017-06-23 DIAGNOSIS — L309 Dermatitis, unspecified: Secondary | ICD-10-CM

## 2017-06-23 DIAGNOSIS — Z0000 Encounter for general adult medical examination without abnormal findings: Secondary | ICD-10-CM

## 2017-06-23 DIAGNOSIS — E782 Mixed hyperlipidemia: Secondary | ICD-10-CM

## 2017-06-23 DIAGNOSIS — Z131 Encounter for screening for diabetes mellitus: Secondary | ICD-10-CM | POA: Diagnosis not present

## 2017-06-23 DIAGNOSIS — I10 Essential (primary) hypertension: Secondary | ICD-10-CM | POA: Diagnosis not present

## 2017-06-23 DIAGNOSIS — J3089 Other allergic rhinitis: Secondary | ICD-10-CM

## 2017-06-23 DIAGNOSIS — Z125 Encounter for screening for malignant neoplasm of prostate: Secondary | ICD-10-CM | POA: Diagnosis not present

## 2017-06-23 LAB — LIPID PANEL
CHOL/HDL RATIO: 3
CHOLESTEROL: 141 mg/dL (ref 0–200)
HDL: 47 mg/dL (ref >39.00)
LDL Cholesterol: 79 mg/dL (ref 0–99)
NonHDL: 94.02
TRIGLYCERIDES: 75 mg/dL (ref 0.0–149.0)
VLDL: 15 mg/dL (ref 0.0–40.0)

## 2017-06-23 LAB — CBC
HCT: 41.6 % (ref 39.0–52.0)
HEMOGLOBIN: 14.3 g/dL (ref 13.0–17.0)
MCHC: 34.5 g/dL (ref 30.0–36.0)
MCV: 96.1 fl (ref 78.0–100.0)
Platelets: 259 10*3/uL (ref 150.0–400.0)
RBC: 4.33 Mil/uL (ref 4.22–5.81)
RDW: 13.2 % (ref 11.5–15.5)
WBC: 6.5 10*3/uL (ref 4.0–10.5)

## 2017-06-23 LAB — COMPREHENSIVE METABOLIC PANEL
ALBUMIN: 4.8 g/dL (ref 3.5–5.2)
ALK PHOS: 68 U/L (ref 39–117)
ALT: 23 U/L (ref 0–53)
AST: 18 U/L (ref 0–37)
BILIRUBIN TOTAL: 0.7 mg/dL (ref 0.2–1.2)
BUN: 15 mg/dL (ref 6–23)
CALCIUM: 9.6 mg/dL (ref 8.4–10.5)
CO2: 31 mEq/L (ref 19–32)
Chloride: 105 mEq/L (ref 96–112)
Creatinine, Ser: 0.89 mg/dL (ref 0.40–1.50)
GFR: 95.43 mL/min (ref >60.00)
GLUCOSE: 102 mg/dL — AB (ref 70–99)
POTASSIUM: 4.6 meq/L (ref 3.5–5.1)
Sodium: 143 mEq/L (ref 135–145)
TOTAL PROTEIN: 7.4 g/dL (ref 6.0–8.3)

## 2017-06-23 LAB — HEMOGLOBIN A1C: HEMOGLOBIN A1C: 5.8 % (ref 4.6–6.5)

## 2017-06-23 LAB — PSA: PSA: 1.18 ng/mL (ref 0.10–4.00)

## 2017-06-23 MED ORDER — FLUOCINONIDE 0.1 % EX CREA: TOPICAL_CREAM | CUTANEOUS | 0 refills | Status: DC

## 2017-06-23 NOTE — Patient Instructions (Addendum)
Great to see you today- I'll be in touch with your labs asap Try the fluocinonide cream on your wrist- however if this does not clear up you skin issue in about 2 weeks- or if it comes back again- please alert me and I will refer you to dermatology  It seems that you have nasal allergies.  I would recommend that we get you off the afrin, as this can actually cause it's own type of nasal irritation and trigger rebound congestion if used for more than a week or so.  Please try the nasacort spray that I gave you instead, and you can experiment with OTC allergy meds (I gave you a sample of Xyzal to try- this is similar to zyrtec) You might try some vaseline in your nose on the bleeding spot to help moisturize and soothe Let me know if not doing ok in this regard      Health Maintenance, Male A healthy lifestyle and preventive care is important for your health and wellness. Ask your health care provider about what schedule of regular examinations is right for you. What should I know about weight and diet? Eat a Healthy Diet  Eat plenty of vegetables, fruits, whole grains, low-fat dairy products, and lean protein.  Do not eat a lot of foods high in solid fats, added sugars, or salt.  Maintain a Healthy Weight Regular exercise can help you achieve or maintain a healthy weight. You should:  Do at least 150 minutes of exercise each week. The exercise should increase your heart rate and make you sweat (moderate-intensity exercise).  Do strength-training exercises at least twice a week.  Watch Your Levels of Cholesterol and Blood Lipids  Have your blood tested for lipids and cholesterol every 5 years starting at 52 years of age. If you are at high risk for heart disease, you should start having your blood tested when you are 52 years old. You may need to have your cholesterol levels checked more often if: ? Your lipid or cholesterol levels are high. ? You are older than 52 years of age. ? You are  at high risk for heart disease.  What should I know about cancer screening? Many types of cancers can be detected early and may often be prevented. Lung Cancer  You should be screened every year for lung cancer if: ? You are a current smoker who has smoked for at least 30 years. ? You are a former smoker who has quit within the past 15 years.  Talk to your health care provider about your screening options, when you should start screening, and how often you should be screened.  Colorectal Cancer  Routine colorectal cancer screening usually begins at 52 years of age and should be repeated every 5-10 years until you are 52 years old. You may need to be screened more often if early forms of precancerous polyps or small growths are found. Your health care provider may recommend screening at an earlier age if you have risk factors for colon cancer.  Your health care provider may recommend using home test kits to check for hidden blood in the stool.  A small camera at the end of a tube can be used to examine your colon (sigmoidoscopy or colonoscopy). This checks for the earliest forms of colorectal cancer.  Prostate and Testicular Cancer  Depending on your age and overall health, your health care provider may do certain tests to screen for prostate and testicular cancer.  Talk to your  health care provider about any symptoms or concerns you have about testicular or prostate cancer.  Skin Cancer  Check your skin from head to toe regularly.  Tell your health care provider about any new moles or changes in moles, especially if: ? There is a change in a mole's size, shape, or color. ? You have a mole that is larger than a pencil eraser.  Always use sunscreen. Apply sunscreen liberally and repeat throughout the day.  Protect yourself by wearing long sleeves, pants, a wide-brimmed hat, and sunglasses when outside.  What should I know about heart disease, diabetes, and high blood  pressure?  If you are 16-85 years of age, have your blood pressure checked every 3-5 years. If you are 6 years of age or older, have your blood pressure checked every year. You should have your blood pressure measured twice-once when you are at a hospital or clinic, and once when you are not at a hospital or clinic. Record the average of the two measurements. To check your blood pressure when you are not at a hospital or clinic, you can use: ? An automated blood pressure machine at a pharmacy. ? A home blood pressure monitor.  Talk to your health care provider about your target blood pressure.  If you are between 84-28 years old, ask your health care provider if you should take aspirin to prevent heart disease.  Have regular diabetes screenings by checking your fasting blood sugar level. ? If you are at a normal weight and have a low risk for diabetes, have this test once every three years after the age of 41. ? If you are overweight and have a high risk for diabetes, consider being tested at a younger age or more often.  A one-time screening for abdominal aortic aneurysm (AAA) by ultrasound is recommended for men aged 32-75 years who are current or former smokers. What should I know about preventing infection? Hepatitis B If you have a higher risk for hepatitis B, you should be screened for this virus. Talk with your health care provider to find out if you are at risk for hepatitis B infection. Hepatitis C Blood testing is recommended for:  Everyone born from [birth year] through 1965.  Anyone with known risk factors for hepatitis C.  Sexually Transmitted Diseases (STDs)  You should be screened each year for STDs including gonorrhea and chlamydia if: ? You are sexually active and are younger than 52 years of age. ? You are older than 52 years of age and your health care provider tells you that you are at risk for this type of infection. ? Your sexual activity has changed since you were last  screened and you are at an increased risk for chlamydia or gonorrhea. Ask your health care provider if you are at risk.  Talk with your health care provider about whether you are at high risk of being infected with HIV. Your health care provider may recommend a prescription medicine to help prevent HIV infection.  What else can I do?  Schedule regular health, dental, and eye exams.  Stay current with your vaccines (immunizations).  Do not use any tobacco products, such as cigarettes, chewing tobacco, and e-cigarettes. If you need help quitting, ask your health care provider.  Limit alcohol intake to no more than 2 drinks per day. One drink equals 12 ounces of beer, 5 ounces of wine, or 1 ounces of hard liquor.  Do not use street drugs.  Do not share needles.  Ask your health care provider for help if you need support or information about quitting drugs.  Tell your health care provider if you often feel depressed.  Tell your health care provider if you have ever been abused or do not feel safe at home. This information is not intended to replace advice given to you by your health care provider. Make sure you discuss any questions you have with your health care provider. Document Released: 11/13/2007 Document Revised: 01/14/2016 Document Reviewed: 02/18/2015 Elsevier Interactive Patient Education  [employer].

## 2017-07-07 ENCOUNTER — Other Ambulatory Visit: Payer: Self-pay | Admitting: Family Medicine

## 2017-09-21 LAB — HEPATIC FUNCTION PANEL
ALK PHOS: 72 (ref 25–125)
ALT: 30 (ref 10–40)
ALT: 30 (ref 10–40)
AST: 21 (ref 14–40)
AST: 21 (ref 14–40)
Alkaline Phosphatase: 72 (ref 25–125)
BILIRUBIN DIRECT: 0.13
BILIRUBIN, TOTAL: 0.5
BILIRUBIN, TOTAL: 0.5

## 2017-09-21 LAB — LIPID PANEL
CHOLESTEROL: 168 (ref 0–200)
Cholesterol: 168 (ref 0–200)
HDL: 48 (ref 35–70)
HDL: 48 (ref 35–70)
LDL CALC: 99
LDL CALC: 99
Triglycerides: 106 (ref 40–160)
Triglycerides: 106 (ref 40–160)

## 2017-09-21 LAB — CBC AND DIFFERENTIAL
HCT: 43 (ref 41–53)
HCT: 43 (ref 41–53)
HEMOGLOBIN: 14.5 (ref 13.5–17.5)
Hemoglobin: 14.5 (ref 13.5–17.5)
Platelets: 266 (ref 150–399)
Platelets: 266 (ref 150–399)
WBC: 10000
WBC: 7

## 2017-09-21 LAB — BASIC METABOLIC PANEL
BUN: 16 (ref 4–21)
BUN: 16 (ref 4–21)
BUN: 16 (ref 4–21)
Creatinine: 0.9 (ref <=1.3)
GLUCOSE: 97
POTASSIUM: 4.2 (ref 3.4–5.3)
POTASSIUM: 4.2 (ref 3.4–5.3)
Potassium: 4.2 (ref 3.4–5.3)
SODIUM: 143 (ref 137–147)
Sodium: 143 (ref 137–147)
Sodium: 143 (ref 137–147)

## 2017-09-21 LAB — MICROALBUMIN, URINE: Microalb, Ur: 4.7

## 2017-09-26 ENCOUNTER — Encounter: Payer: Self-pay | Admitting: Family Medicine

## 2017-09-26 LAB — CHLORIDE: CHLORIDE: 104

## 2017-09-26 LAB — LIPID PANEL WITH LDL/HDL RATIO: RATIO: 3.5

## 2017-09-26 LAB — CALCIUM: CALCIUM: 9.7

## 2017-09-26 LAB — RBC: RBC: 4.41

## 2017-10-05 ENCOUNTER — Encounter: Payer: Self-pay | Admitting: Family Medicine

## 2017-11-15 ENCOUNTER — Other Ambulatory Visit: Payer: Self-pay | Admitting: Family Medicine

## 2017-11-15 DIAGNOSIS — E785 Hyperlipidemia, unspecified: Secondary | ICD-10-CM

## 2017-11-15 NOTE — Telephone Encounter (Signed)
Simvastatin refill request received. Pt has not been in the office since 05/2017. Routed to Dr. Lorelei Pont for review.

## 2017-11-21 ENCOUNTER — Other Ambulatory Visit: Payer: Self-pay | Admitting: Family Medicine

## 2017-11-21 DIAGNOSIS — I10 Essential (primary) hypertension: Secondary | ICD-10-CM

## 2018-02-20 ENCOUNTER — Other Ambulatory Visit: Payer: Self-pay | Admitting: Family Medicine

## 2018-02-20 DIAGNOSIS — I10 Essential (primary) hypertension: Secondary | ICD-10-CM

## 2018-02-23 ENCOUNTER — Telehealth: Payer: Self-pay | Admitting: Family Medicine

## 2018-02-23 DIAGNOSIS — S39012A Strain of muscle, fascia and tendon of lower back, initial encounter: Secondary | ICD-10-CM

## 2018-02-23 MED ORDER — CYCLOBENZAPRINE HCL 10 MG PO TABS: 10.0000 mg | ORAL_TABLET | Freq: Two times a day (BID) | ORAL | 0 refills | Status: DC | PRN

## 2018-02-23 NOTE — Telephone Encounter (Signed)
Attempted to call pt. To discuss need for Cyclobenazprine; LRF 11/26/15 for lumbar strain. Advised to return call to office to discuss further.

## 2018-02-23 NOTE — Telephone Encounter (Signed)
Pt called to check status; contact pt to give an update

## 2018-02-23 NOTE — Telephone Encounter (Signed)
Taken care of

## 2018-02-23 NOTE — Telephone Encounter (Signed)
Copied from [place] [id number] [id number]. Topic: Quick Communication - See Telephone Encounter >> Feb 23, 2018  8:59 AM Hewitt Shorts wrote: Pt is needing a refill on cyclobenzaprine  [facility] [place]   Best number [phone]

## 2018-02-23 NOTE — Telephone Encounter (Signed)
Pt. Reports his lumbar back has started hurting this week. Does not remember hurting his back. Has an appointment for Monday 02/27/18 for evaluation of this. Requesting enough Cyclobenzaprine until his appointment.

## 2018-02-25 NOTE — Progress Notes (Addendum)
[facility] [place], [place], [place] [address] [phone]  Date:  02/27/2018   Name:  Gabriel Oconnell   DOB:  12-23-1965   MRN:  [id number]  PCP:  Darreld Mclean, MD    Chief Complaint: Back Pain (constant lower L back pain, radiating down L leg, tingling X 2 wks)   History of Present Illness:  Gabriel Oconnell is a 52 y.o. very pleasant male patient who presents with the following: History of hyperlipidemia, HTN, overweight Here today with lower back pain radiating down the leg He noted this about 2 weeks ago- his back seemed to pull/ hurt in his lower left back NKI- he did not do anything unusual He took a day off work to rest He notes the pain running down his leg the whole time  He has used aleve but sx have come back He also used some flexeril No bowel or bladder function change The leg feels weak but not numb  Last seen here in January for a CPE  Flu: he will do this at work in about one week  We did do lumbar films for him in 2017 as follows, possible pars defect. I had ordered a CT for him but he did not end up going  IMPRESSION: 1. Degenerative disc space narrowing at L4-5 and L5-S1. 2. Possible pars defect likely on the right at L5. No spondylolisthesis. CT scan of the lumbar spine would be a useful next imaging step to better evaluate the pars interarticularis at L5. Wt Readings from Last 3 Encounters:  02/27/18 243 lb (110.2 kg)  06/23/17 244 lb 6.4 oz (110.9 kg)  06/21/16 240 lb 9.6 oz (109.1 kg)    Patient Active Problem List   Diagnosis Date Noted  . Obesity 07/03/2015  . Right foot pain 04/30/2015  . Left foot pain 06/27/2014  . Vestibular schwannoma ([place]) 12/06/2012  . Ringing in right ear 10/12/2012  . Hip pain 07/07/2012  . Family hx of colon cancer 09/29/2010  . DANDRUFF 08/07/2008  . LIPOMA 03/25/2008  . Hyperlipidemia 08/24/2006  . Essential hypertension 08/24/2006     Past Medical History:  Diagnosis Date  . Acoustic neuroma ([place])   . Hyperlipidemia   . Hypertension     Past Surgical History:  Procedure Laterality Date  . KNEE SURGERY     left    Social History   Tobacco Use  . Smoking status: Never Smoker  . Smokeless tobacco: Never Used  Substance Use Topics  . Alcohol use: Yes    Alcohol/week: 2.0 standard drinks    Types: 2 Standard drinks or equivalent per week    Comment: 2-3 drinks per week per pt  . Drug use: No    Family History  Problem Relation Age of Onset  . Pancreatic cancer Father   . Colon polyps Father   . Colon cancer Father        late 58's  . Colon cancer Paternal Grandmother        early 43's  . Heart disease Mother     No Known Allergies  Medication list has been reviewed and updated.  Current Outpatient Medications on File Prior to Visit  Medication Sig Dispense Refill  . amLODipine (NORVASC) 10 MG tablet TAKE 1 TABLET DAILY (NEED OFFICE VISIT FOR FURTHER REFILLS) 30 tablet 0  . aspirin (BAYER LOW DOSE) 81 MG chewable tablet Chew 81 mg by mouth daily.    [place] [facility]  cyclobenzaprine (FLEXERIL) 10 MG tablet Take 1 tablet (10 mg total) by mouth 2 (two) times daily as needed for muscle spasms. 30 tablet 0  . glucosamine-chondroitin 500-400 MG tablet Take 1 tablet by mouth daily.      . Multiple Vitamins-Minerals (MULTIVITAMIN,TX-MINERALS) tablet Take 1 tablet by mouth daily.      . simvastatin (ZOCOR) 20 MG tablet TAKE 1 TABLET DAILY 90 tablet 3  . valsartan-hydrochlorothiazide (DIOVAN-HCT) 320-12.5 MG tablet TAKE 1 TABLET DAILY (NEED OFFICE VISIT FOR FURTHER REFILLS) 30 tablet 0   No current facility-administered medications on file prior to visit.     Review of Systems: As per HPI- otherwise negative  Physical Examination: Vitals:   02/27/18 1407  BP: 118/86  Pulse: 90  Resp: 16  Temp: 98.2 F (36.8 C)  SpO2: 98%   Vitals:   02/27/18 1407  Weight: 243 lb (110.2 kg)  Height: 6' (1.829 m)    Body mass index is 32.96 kg/m. Ideal Body Weight: Weight in (lb) to have BMI = 25: 183.9  GEN: WDWN, NAD, Non-toxic, A & O x 3, overweight, looks well  HEENT: Atraumatic, Normocephalic. Neck supple. No masses, No LAD. Ears and Nose: No external deformity. CV: RRR, No M/G/R. No JVD. No thrill. No extra heart sounds. PULM: CTA B, no wheezes, crackles, rhonchi. No retractions. No resp. distress. No accessory muscle use. ABD: S, NT, ND, +BS. No rebound. No HSM. EXTR: No c/c/e NEURO Normal gait.  PSYCH: Normally interactive. Conversant. Not depressed or anxious appearing.  Calm demeanor.  positive SLR on the left only  He indicates tenderness in the left lower back Normal BLE strength and sensation Normal DTR in both legs    Assessment and Plan: Low back pain radiating to left lower extremity - Plan: DG Lumbar Spine Complete, predniSONE (DELTASONE) 20 MG tablet  Here today with left sided sciatica type pain Treat for a possible bulging disc with prednisone He has flexeril at the drug store if he wants to pick it up Repeat lumbar films for him today- await these reports   Signed Lamar Blinks, MD  Received his x-rays, message to pt  Your arthritis in your back is a bit worse than last time.   Again, possible chronic stress type fracture at L5 (pars defect)- there is nothing that needs to be done here unless your symptoms do not go away.  In that case we would want to consider an MRI.   Please keep me posted!   Dg Lumbar Spine Complete  Result Date: 02/27/2018 CLINICAL DATA:  52 year old male with low back pain and left radiculopathy for 2 weeks. No injury. Subsequent encounter. EXAM: LUMBAR SPINE - COMPLETE 4+ VIEW COMPARISON:  11/26/2015 plain film exam. FINDINGS: Slight straightening of the lumbar spine.  No compression fracture. L4-5 moderate disc space narrowing. Moderate right-sided L5-S1 facet degenerative changes. Question left L5 pars defect. Minimal anterior slip L5.  Mild L5-S1 disc space narrowing. Moderate stool throughout the colon. IMPRESSION: 1. Moderate L4-5 disc space narrowing has progressed slightly since prior examination. 2. Moderate right-sided L5-S1 facet degenerative changes. Question left L5 pars defect. Minimal anterior slip L5. Mild L5-S1 disc space narrowing. Electronically Signed   By: Genia Del M.D.   On: 02/27/2018 18:01

## 2018-02-27 ENCOUNTER — Other Ambulatory Visit: Payer: Self-pay

## 2018-02-27 ENCOUNTER — Encounter: Payer: Self-pay | Admitting: Family Medicine

## 2018-02-27 ENCOUNTER — Ambulatory Visit (HOSPITAL_BASED_OUTPATIENT_CLINIC_OR_DEPARTMENT_OTHER)
Admission: RE | Admit: 2018-02-27 | Discharge: 2018-02-27 | Disposition: A | Discharge status: Home / Self Care | Payer: BLUE CROSS/BLUE SHIELD | Source: Ambulatory Visit | Attending: Family Medicine | Admitting: Family Medicine

## 2018-02-27 ENCOUNTER — Ambulatory Visit: Payer: BLUE CROSS/BLUE SHIELD | Admitting: Family Medicine

## 2018-02-27 VITALS — BP 118/86 | HR 90 | Temp 98.2°F | Resp 16 | Ht 72.0 in | Wt 243.0 lb

## 2018-02-27 DIAGNOSIS — M545 Low back pain, unspecified: Secondary | ICD-10-CM

## 2018-02-27 DIAGNOSIS — M4807 Spinal stenosis, lumbosacral region: Secondary | ICD-10-CM | POA: Insufficient documentation

## 2018-02-27 DIAGNOSIS — M79605 Pain in left leg: Secondary | ICD-10-CM

## 2018-02-27 DIAGNOSIS — M48061 Spinal stenosis, lumbar region without neurogenic claudication: Secondary | ICD-10-CM | POA: Insufficient documentation

## 2018-02-27 DIAGNOSIS — M47897 Other spondylosis, lumbosacral region: Secondary | ICD-10-CM | POA: Insufficient documentation

## 2018-02-27 DIAGNOSIS — M4317 Spondylolisthesis, lumbosacral region: Secondary | ICD-10-CM | POA: Insufficient documentation

## 2018-02-27 MED ORDER — PREDNISONE 20 MG PO TABS: ORAL_TABLET | ORAL | 0 refills | Status: DC

## 2018-02-27 NOTE — Progress Notes (Signed)
Pt. Taking aleve every morning for back pain X 2 weeks.

## 2018-02-27 NOTE — Patient Instructions (Signed)
It was good to see you today You will get your flu shot at work  I suspect that you have a bulging disc causing your back and leg pain We hope that the prednisone will resolve this If you are not better with the prednisone, or if symptoms go away and then come back please alert me Don't use aleve or ibuprofen while on prednisone- tylenol, flexeril are fine I will be in touch with your x-ray reports asap

## 2018-03-09 ENCOUNTER — Encounter: Payer: Self-pay | Admitting: Family Medicine

## 2018-03-09 DIAGNOSIS — M79605 Pain in left leg: Secondary | ICD-10-CM

## 2018-03-09 DIAGNOSIS — M545 Low back pain, unspecified: Secondary | ICD-10-CM

## 2018-03-09 MED ORDER — PREDNISONE 20 MG PO TABS: ORAL_TABLET | ORAL | 0 refills | Status: DC

## 2018-04-24 ENCOUNTER — Encounter: Payer: Self-pay | Admitting: Family Medicine

## 2018-04-24 DIAGNOSIS — M545 Low back pain, unspecified: Secondary | ICD-10-CM

## 2018-04-24 DIAGNOSIS — M79605 Pain in left leg: Secondary | ICD-10-CM

## 2018-04-25 MED ORDER — PREDNISONE 20 MG PO TABS: ORAL_TABLET | ORAL | 0 refills | Status: DC

## 2018-04-25 NOTE — Addendum Note (Signed)
Addended by: Lamar Blinks C on: 04/25/2018 07:43 AM   Modules accepted: Orders

## 2018-05-08 ENCOUNTER — Encounter: Payer: Self-pay | Admitting: Family Medicine

## 2018-09-27 ENCOUNTER — Encounter: Payer: Self-pay | Admitting: Family Medicine

## 2018-09-28 ENCOUNTER — Telehealth: Payer: Self-pay

## 2018-09-28 ENCOUNTER — Other Ambulatory Visit: Payer: Self-pay

## 2018-09-28 ENCOUNTER — Encounter: Payer: Self-pay | Admitting: Family Medicine

## 2018-09-28 ENCOUNTER — Encounter: Payer: BLUE CROSS/BLUE SHIELD | Admitting: Family Medicine

## 2018-09-28 ENCOUNTER — Other Ambulatory Visit: Payer: BLUE CROSS/BLUE SHIELD

## 2018-09-28 ENCOUNTER — Ambulatory Visit (INDEPENDENT_AMBULATORY_CARE_PROVIDER_SITE_OTHER): Payer: BLUE CROSS/BLUE SHIELD | Admitting: Family Medicine

## 2018-09-28 DIAGNOSIS — Z125 Encounter for screening for malignant neoplasm of prostate: Secondary | ICD-10-CM

## 2018-09-28 DIAGNOSIS — Z131 Encounter for screening for diabetes mellitus: Secondary | ICD-10-CM

## 2018-09-28 DIAGNOSIS — E785 Hyperlipidemia, unspecified: Secondary | ICD-10-CM

## 2018-09-28 DIAGNOSIS — E782 Mixed hyperlipidemia: Secondary | ICD-10-CM | POA: Diagnosis not present

## 2018-09-28 DIAGNOSIS — I10 Essential (primary) hypertension: Secondary | ICD-10-CM | POA: Diagnosis not present

## 2018-09-28 DIAGNOSIS — Z0000 Encounter for general adult medical examination without abnormal findings: Secondary | ICD-10-CM | POA: Diagnosis not present

## 2018-09-28 LAB — CBC
HCT: 42.6 % (ref 39.0–52.0)
Hemoglobin: 15.1 g/dL (ref 13.0–17.0)
MCHC: 35.4 g/dL (ref 30.0–36.0)
MCV: 94.8 fl (ref 78.0–100.0)
Platelets: 252 10*3/uL (ref 150.0–400.0)
RBC: 4.49 Mil/uL (ref 4.22–5.81)
RDW: 12.7 % (ref 11.5–15.5)
WBC: 7.2 10*3/uL (ref 4.0–10.5)

## 2018-09-28 LAB — COMPREHENSIVE METABOLIC PANEL
ALT: 21 U/L (ref 0–53)
AST: 19 U/L (ref 0–37)
Albumin: 4.6 g/dL (ref 3.5–5.2)
Alkaline Phosphatase: 68 U/L (ref 39–117)
BUN: 13 mg/dL (ref 6–23)
CO2: 29 mEq/L (ref 19–32)
Calcium: 9.5 mg/dL (ref 8.4–10.5)
Chloride: 102 mEq/L (ref 96–112)
Creatinine, Ser: 0.92 mg/dL (ref 0.40–1.50)
GFR: 86 mL/min (ref >60.00)
Glucose, Bld: 97 mg/dL (ref 70–99)
Potassium: 3.9 mEq/L (ref 3.5–5.1)
Sodium: 140 mEq/L (ref 135–145)
Total Bilirubin: 0.8 mg/dL (ref 0.2–1.2)
Total Protein: 7.1 g/dL (ref 6.0–8.3)

## 2018-09-28 LAB — LIPID PANEL
Cholesterol: 177 mg/dL (ref 0–200)
HDL: 44.4 mg/dL (ref >39.00)
LDL Cholesterol: 99 mg/dL (ref 0–99)
NonHDL: 132.89
Total CHOL/HDL Ratio: 4
Triglycerides: 168 mg/dL — ABNORMAL HIGH (ref 0.0–149.0)
VLDL: 33.6 mg/dL (ref 0.0–40.0)

## 2018-09-28 LAB — PSA: PSA: 0.99 ng/mL (ref 0.10–4.00)

## 2018-09-28 LAB — HEMOGLOBIN A1C: Hgb A1c MFr Bld: 5.9 % (ref 4.6–6.5)

## 2018-09-28 MED ORDER — VALSARTAN-HYDROCHLOROTHIAZIDE 320-12.5 MG PO TABS: ORAL_TABLET | ORAL | 3 refills | Status: DC

## 2018-09-28 MED ORDER — SIMVASTATIN 20 MG PO TABS: 20.0000 mg | ORAL_TABLET | Freq: Every day | ORAL | 3 refills | Status: DC

## 2018-09-28 MED ORDER — AMLODIPINE BESYLATE 10 MG PO TABS: 10.0000 mg | ORAL_TABLET | Freq: Every day | ORAL | 3 refills | Status: DC

## 2018-09-28 NOTE — Telephone Encounter (Signed)
Copied from [place] [id number]. Topic: Appointment Scheduling - Scheduling Inquiry for Clinic >> Sep 27, 2018  5:19 PM Alanda Slim E wrote: Reason for CRM: Pt called and wants to confirm his appt  to be changed to virtual visit and moved by via Dr. Oren Section message to 8:40 or 9am / please call Pt first thing in the morning to go over virtual appt

## 2018-09-28 NOTE — Progress Notes (Addendum)
[place] at [facility] [address] [place], [place] [address] [phone]  Date:  09/28/2018   Name:  Gabriel Oconnell   DOB:  1965/08/05   MRN:  [id number]  PCP:  Darreld Mclean, MD    Chief Complaint: No chief complaint on file.   History of Present Illness:  Gabriel Oconnell is a 53 y.o. very pleasant male patient who presents with the following: Virtual visit today due to pandemic Patient location is car- he is planning to come in for labs His job is essential- he is working slightly reduced hours but still going in Provider location is office Patient ID confirmed with name and date of birth, he gives consent for virtual visit today  I last saw Gabriel Oconnell in the fall with concern of back pain He has history of hypertension, hyperlipidemia, overweight Due for complete labs including PSA  Lab Results  Component Value Date   PSA 1.18 06/23/2017   PSA 1.1 09/23/2016   PSA 1.04 12/25/2014    He does have vestibular schwannoma, diagnosed due to right-sided tinnitus He has been to see ENT at [facility], Dr. Tyrone Apple do not see where he did need further follow-up Asked pt about this - he is now seeing a specialist in [facility]; he sees him every 2 years and has an MRI They check his hearing  At this point they do not want to do surgery They don't think that surgery will fix the tinnitus, and could cause him to lose his hearing entirely  He is taking amlodipine 10 Aspirin 81 Simvastatin 20 Valsartan/HCTZ Colon cancer screening is up-to-date  He would like to exercise more- he is doing the best he can  He will ride his exercise bike 3-4x  No CP or SOB with exercise   No tobacco Moderate alcohol His daughter is an Therapist, sports at [employer]- she is in the neuro ICU His son is at [employer] state- he is studying from home right now   Patient Active Problem List   Diagnosis Date Noted  . Obesity 07/03/2015  . Right foot pain 04/30/2015  .  Left foot pain 06/27/2014  . Vestibular schwannoma ([place]) 12/06/2012  . Ringing in right ear 10/12/2012  . Hip pain 07/07/2012  . Family hx of colon cancer 09/29/2010  . DANDRUFF 08/07/2008  . LIPOMA 03/25/2008  . Hyperlipidemia 08/24/2006  . Essential hypertension 08/24/2006    Past Medical History:  Diagnosis Date  . Acoustic neuroma ([place])   . Hyperlipidemia   . Hypertension     Past Surgical History:  Procedure Laterality Date  . KNEE SURGERY     left    Social History   Tobacco Use  . Smoking status: Never Smoker  . Smokeless tobacco: Never Used  Substance Use Topics  . Alcohol use: Yes    Alcohol/week: 2.0 standard drinks    Types: 2 Standard drinks or equivalent per week    Comment: 2-3 drinks per week per pt  . Drug use: No    Family History  Problem Relation Age of Onset  . Pancreatic cancer Father   . Colon polyps Father   . Colon cancer Father        late 56's  . Colon cancer Paternal Grandmother        early 90's  . Heart disease Mother     No Known Allergies  Medication list has been reviewed and updated.  Current Outpatient  Medications on File Prior to Visit  Medication Sig Dispense Refill  . amLODipine (NORVASC) 10 MG tablet TAKE 1 TABLET DAILY (NEED OFFICE VISIT FOR FURTHER REFILLS) 30 tablet 0  . aspirin (BAYER LOW DOSE) 81 MG chewable tablet Chew 81 mg by mouth daily.    . cyclobenzaprine (FLEXERIL) 10 MG tablet Take 1 tablet (10 mg total) by mouth 2 (two) times daily as needed for muscle spasms. 30 tablet 0  . glucosamine-chondroitin 500-400 MG tablet Take 1 tablet by mouth daily.      . Multiple Vitamins-Minerals (MULTIVITAMIN,TX-MINERALS) tablet Take 1 tablet by mouth daily.      . predniSONE (DELTASONE) 20 MG tablet Take 3 pills a day for 3 days, then 2 pills a day for 3 days, then 1 pill a day for 3 days 18 tablet 0  . simvastatin (ZOCOR) 20 MG tablet TAKE 1 TABLET DAILY 90 tablet 3  . valsartan-hydrochlorothiazide (DIOVAN-HCT)  320-12.5 MG tablet TAKE 1 TABLET DAILY (NEED OFFICE VISIT FOR FURTHER REFILLS) 30 tablet 0   No current facility-administered medications on file prior to visit.     Review of Systems:  As per HPI- otherwise negative. No fever or chills  No cough  Physical Examination: There were no vitals filed for this visit. There were no vitals filed for this visit. There is no height or weight on file to calculate BMI. Ideal Body Weight:    He does check his BP at home on occasion He got 132/84 recently - this is a typical number for him   He has gained a little weight over the winter-he is working on exercise to lose this again  Wt Readings from Last 3 Encounters:  02/27/18 243 lb (110.2 kg)  06/23/17 244 lb 6.4 oz (110.9 kg)  06/21/16 240 lb 9.6 oz (109.1 kg)   Assessment and Plan: Physical exam  Mixed hyperlipidemia - Plan: Lipid panel  Screening for diabetes mellitus - Plan: Comprehensive metabolic panel, Hemoglobin A1c  Essential hypertension, benign - Plan: CBC, Comprehensive metabolic panel  Screening for prostate cancer - Plan: PSA  Essential hypertension - Plan: amLODipine (NORVASC) 10 MG tablet, valsartan-hydrochlorothiazide (DIOVAN-HCT) 320-12.5 MG tablet  Hyperlipidemia - Plan: simvastatin (ZOCOR) 20 MG tablet  Following up for a virtual physical today.  Gabriel Oconnell is overall doing well, he is trying to exercise and watch his diet He notes good blood pressure control at home Refilled his blood pressure and cholesterol medications Ordered routine labs, he plans to come in for blood draw today Discussed shingles vaccine, encouraged to have this done when feasible His vestibular schwannoma is being managed by an ENT specialist in [place]  I sent patient a MyChart message with health maintenance instructions  Signed Lamar Blinks, MD  Received his labs,mesage to pt  Results for orders placed or performed in visit on 09/28/18  CBC  Result Value Ref Range   WBC 7.2  4.0 - 10.5 K/uL   RBC 4.49 4.22 - 5.81 Mil/uL   Platelets 252.0 150.0 - 400.0 K/uL   Hemoglobin 15.1 13.0 - 17.0 g/dL   HCT 42.6 39.0 - 52.0 %   MCV 94.8 78.0 - 100.0 fl   MCHC 35.4 30.0 - 36.0 g/dL   RDW 12.7 11.5 - 15.5 %  Comprehensive metabolic panel  Result Value Ref Range   Sodium 140 135 - 145 mEq/L   Potassium 3.9 3.5 - 5.1 mEq/L   Chloride 102 96 - 112 mEq/L   CO2 29 19 - 32 mEq/L  Glucose, Bld 97 70 - 99 mg/dL   BUN 13 6 - 23 mg/dL   Creatinine, Ser 0.92 0.40 - 1.50 mg/dL   Total Bilirubin 0.8 0.2 - 1.2 mg/dL   Alkaline Phosphatase 68 39 - 117 U/L   AST 19 0 - 37 U/L   ALT 21 0 - 53 U/L   Total Protein 7.1 6.0 - 8.3 g/dL   Albumin 4.6 3.5 - 5.2 g/dL   Calcium 9.5 8.4 - 10.5 mg/dL   GFR 86.00 >60.00 mL/min  Hemoglobin A1c  Result Value Ref Range   Hgb A1c MFr Bld 5.9 4.6 - 6.5 %  Lipid panel  Result Value Ref Range   Cholesterol 177 0 - 200 mg/dL   Triglycerides 168.0 (H) 0.0 - 149.0 mg/dL   HDL 44.40 >39.00 mg/dL   VLDL 33.6 0.0 - 40.0 mg/dL   LDL Cholesterol 99 0 - 99 mg/dL   Total CHOL/HDL Ratio 4    NonHDL 132.89   PSA  Result Value Ref Range   PSA 0.99 0.10 - 4.00 ng/mL   Lab Results  Component Value Date   PSA 0.99 09/28/2018   PSA 1.18 06/23/2017   PSA 1.1 09/23/2016

## 2018-10-07 ENCOUNTER — Encounter: Payer: Self-pay | Admitting: Family Medicine

## 2019-01-05 DIAGNOSIS — R229 Localized swelling, mass and lump, unspecified: Secondary | ICD-10-CM | POA: Diagnosis not present

## 2019-01-05 DIAGNOSIS — L820 Inflamed seborrheic keratosis: Secondary | ICD-10-CM | POA: Diagnosis not present

## 2019-01-05 DIAGNOSIS — D485 Neoplasm of uncertain behavior of skin: Secondary | ICD-10-CM | POA: Diagnosis not present

## 2019-01-05 DIAGNOSIS — L821 Other seborrheic keratosis: Secondary | ICD-10-CM | POA: Diagnosis not present

## 2019-04-13 DIAGNOSIS — L720 Epidermal cyst: Secondary | ICD-10-CM | POA: Diagnosis not present

## 2019-04-13 DIAGNOSIS — D485 Neoplasm of uncertain behavior of skin: Secondary | ICD-10-CM | POA: Diagnosis not present

## 2019-09-23 ENCOUNTER — Other Ambulatory Visit: Payer: Self-pay | Admitting: Family Medicine

## 2019-09-23 DIAGNOSIS — I10 Essential (primary) hypertension: Secondary | ICD-10-CM

## 2019-10-01 ENCOUNTER — Other Ambulatory Visit: Payer: Self-pay | Admitting: Family Medicine

## 2019-10-01 DIAGNOSIS — E785 Hyperlipidemia, unspecified: Secondary | ICD-10-CM

## 2019-12-19 ENCOUNTER — Ambulatory Visit: Payer: BLUE CROSS/BLUE SHIELD | Admitting: Family Medicine

## 2019-12-31 ENCOUNTER — Other Ambulatory Visit: Payer: Self-pay | Admitting: Family Medicine

## 2019-12-31 DIAGNOSIS — E785 Hyperlipidemia, unspecified: Secondary | ICD-10-CM

## 2020-01-20 NOTE — Patient Instructions (Signed)
Great to see you again today! I will be in touch with your labs asap Plan for a covid booster around December Please ask your insurance company about coverage of shingles vaccine I will set you up to see orthopedics to check out your joints     Health Maintenance, Male Adopting a healthy lifestyle and getting preventive care are important in promoting health and wellness. Ask your health care provider about:  The right schedule for you to have regular tests and exams.  Things you can do on your own to prevent diseases and keep yourself healthy. What should I know about diet, weight, and exercise? Eat a healthy diet   Eat a diet that includes plenty of vegetables, fruits, low-fat dairy products, and lean protein.  Do not eat a lot of foods that are high in solid fats, added sugars, or sodium. Maintain a healthy weight Body mass index (BMI) is a measurement that can be used to identify possible weight problems. It estimates body fat based on height and weight. Your health care provider can help determine your BMI and help you achieve or maintain a healthy weight. Get regular exercise Get regular exercise. This is one of the most important things you can do for your health. Most adults should:  Exercise for at least 150 minutes each week. The exercise should increase your heart rate and make you sweat (moderate-intensity exercise).  Do strengthening exercises at least twice a week. This is in addition to the moderate-intensity exercise.  Spend less time sitting. Even light physical activity can be beneficial. Watch cholesterol and blood lipids Have your blood tested for lipids and cholesterol at 54 years of age, then have this test every 5 years. You may need to have your cholesterol levels checked more often if:  Your lipid or cholesterol levels are high.  You are older than 54 years of age.  You are at high risk for heart disease. What should I know about cancer screening? Many  types of cancers can be detected early and may often be prevented. Depending on your health history and family history, you may need to have cancer screening at various ages. This may include screening for:  Colorectal cancer.  Prostate cancer.  Skin cancer.  Lung cancer. What should I know about heart disease, diabetes, and high blood pressure? Blood pressure and heart disease  High blood pressure causes heart disease and increases the risk of stroke. This is more likely to develop in people who have high blood pressure readings, are of African descent, or are overweight.  Talk with your health care provider about your target blood pressure readings.  Have your blood pressure checked: ? Every 3-5 years if you are 16-26 years of age. ? Every year if you are 66 years old or older.  If you are between the ages of 29 and 58 and are a current or former smoker, ask your health care provider if you should have a one-time screening for abdominal aortic aneurysm (AAA). Diabetes Have regular diabetes screenings. This checks your fasting blood sugar level. Have the screening done:  Once every three years after age 71 if you are at a normal weight and have a low risk for diabetes.  More often and at a younger age if you are overweight or have a high risk for diabetes. What should I know about preventing infection? Hepatitis B If you have a higher risk for hepatitis B, you should be screened for this virus. Talk with your  health care provider to find out if you are at risk for hepatitis B infection. Hepatitis C Blood testing is recommended for:  Everyone born from [birth year] through 1965.  Anyone with known risk factors for hepatitis C. Sexually transmitted infections (STIs)  You should be screened each year for STIs, including gonorrhea and chlamydia, if: ? You are sexually active and are younger than 54 years of age. ? You are older than 54 years of age and your health care provider tells you  that you are at risk for this type of infection. ? Your sexual activity has changed since you were last screened, and you are at increased risk for chlamydia or gonorrhea. Ask your health care provider if you are at risk.  Ask your health care provider about whether you are at high risk for HIV. Your health care provider may recommend a prescription medicine to help prevent HIV infection. If you choose to take medicine to prevent HIV, you should first get tested for HIV. You should then be tested every 3 months for as long as you are taking the medicine. Follow these instructions at home: Lifestyle  Do not use any products that contain nicotine or tobacco, such as cigarettes, e-cigarettes, and chewing tobacco. If you need help quitting, ask your health care provider.  Do not use street drugs.  Do not share needles.  Ask your health care provider for help if you need support or information about quitting drugs. Alcohol use  Do not drink alcohol if your health care provider tells you not to drink.  If you drink alcohol: ? Limit how much you have to 0-2 drinks a day. ? Be aware of how much alcohol is in your drink. In the [place] one drink equals one 12 oz bottle of beer (355 mL), one 5 oz glass of wine (148 mL), or one 1 oz glass of hard liquor (44 mL). General instructions  Schedule regular health, dental, and eye exams.  Stay current with your vaccines.  Tell your health care provider if: ? You often feel depressed. ? You have ever been abused or do not feel safe at home. Summary  Adopting a healthy lifestyle and getting preventive care are important in promoting health and wellness.  Follow your health care provider's instructions about healthy diet, exercising, and getting tested or screened for diseases.  Follow your health care provider's instructions on monitoring your cholesterol and blood pressure. This information is not intended to replace advice given to you by your  health care provider. Make sure you discuss any questions you have with your health care provider. Document Revised: 05/10/2018 Document Reviewed: 05/10/2018 Elsevier Patient Education  2020 [employer].

## 2020-01-20 NOTE — Progress Notes (Addendum)
[place] at [facility] [address], [place], [place] [address] [phone]  Date:  01/23/2020   Name:  Gabriel Oconnell   DOB:  03-05-1966   MRN:  [id number]  PCP:  Darreld Mclean, MD    Chief Complaint: Annual Exam   History of Present Illness:  Gabriel Oconnell is a 54 y.o. very pleasant male patient who presents with the following:  Annual physical today Last seen by myself 08/2018 for virtual due to pandemic He has history of hypertension, hyperlipidemia, overweight, prediabetes   He is generally doing well, except he is having more pain in his knees and hips.  Both knees can be painful and stiff His left hip is the most painful, but he notes limited range of motion in the right hip.  This is problematic when he attempts to mount his motorcycle and has held him back from his hobby which he enjoys  He does have vestibular schwannoma, diagnosed due to right-sided tinnitus He has been to see ENT at [facility], Dr. Oletta Cohn Currently is now seeing a specialist in [facility]; he sees him every 2 years and has an MRI They check his hearing  At this point they do not want to do surgery They don't think that surgery will fix the tinnitus, and could cause him to lose his hearing entirely  Needs routine labs- fasting  Colon due next year covid series- done!  Discussed booster shingrix- discussed with him today, he plans to check about insurance coverage prior to having this vaccine  Never a smoker Exercise: he is stretching, and he uses a home cardio machine a few times a week He also does some walking and hiking on the weekends Married to [place]  He had some back pain last week- got better after a couple of days He uses flexeril for this prn-would like a refill  Amlodipine zocor diovan hct  Wt Readings from Last 3 Encounters:  01/23/20 244 lb (110.7 kg)  02/27/18 243 lb (110.2 kg)  06/23/17 244 lb 6.4 oz (110.9 kg)    He is doing some intermittent fasting and has lost about 7 lbs   Patient Active Problem List   Diagnosis Date Noted  . Prediabetes 01/24/2020  . Obesity 07/03/2015  . Right foot pain 04/30/2015  . Left foot pain 06/27/2014  . Vestibular schwannoma ([place]) 12/06/2012  . Ringing in right ear 10/12/2012  . Hip pain 07/07/2012  . Family hx of colon cancer 09/29/2010  . DANDRUFF 08/07/2008  . LIPOMA 03/25/2008  . Hyperlipidemia 08/24/2006  . Essential hypertension 08/24/2006    Past Medical History:  Diagnosis Date  . Acoustic neuroma ([place])   . Hyperlipidemia   . Hypertension     Past Surgical History:  Procedure Laterality Date  . KNEE SURGERY     left    Social History   Tobacco Use  . Smoking status: Never Smoker  . Smokeless tobacco: Never Used  Substance Use Topics  . Alcohol use: Yes    Alcohol/week: 2.0 standard drinks    Types: 2 Standard drinks or equivalent per week    Comment: 2-3 drinks per week per pt  . Drug use: No    Family History  Problem Relation Age of Onset  . Pancreatic cancer Father   . Colon polyps Father   . Colon cancer Father        late 44's  . Colon cancer Paternal Grandmother  early 56's  . Heart disease Mother     No Known Allergies  Medication list has been reviewed and updated.  Current Outpatient Medications on File Prior to Visit  Medication Sig Dispense Refill  . amLODipine (NORVASC) 10 MG tablet TAKE 1 TABLET DAILY 90 tablet 3  . aspirin (BAYER LOW DOSE) 81 MG chewable tablet Chew 81 mg by mouth daily.    [place] glucosamine-chondroitin 500-400 MG tablet Take 1 tablet by mouth daily.      . Multiple Vitamins-Minerals (MULTIVITAMIN,TX-MINERALS) tablet Take 1 tablet by mouth daily.      . valsartan-hydrochlorothiazide (DIOVAN-HCT) 320-12.5 MG tablet TAKE 1 TABLET DAILY 90 tablet 3   No current facility-administered medications on file prior to visit.    Review of Systems:  As per HPI- otherwise  negative.    Physical Examination: Vitals:   01/23/20 0840  BP: 128/82  Pulse: 67  Resp: 17  Temp: 98.6 F (37 C)  SpO2: 97%   Vitals:   01/23/20 0840  Weight: 244 lb (110.7 kg)  Height: 6' (1.829 m)   Body mass index is 33.09 kg/m. Ideal Body Weight: Weight in (lb) to have BMI = 25: 183.9  GEN: no acute distress.  Overweight, otherwise looks well HEENT: Atraumatic, Normocephalic.  Ears and Nose: No external deformity. CV: RRR, No M/G/R. No JVD. No thrill. No extra heart sounds. PULM: CTA B, no wheezes, crackles, rhonchi. No retractions. No resp. distress. No accessory muscle use. ABD: S, NT, ND, +BS. No rebound. No HSM. EXTR: No c/c/e PSYCH: Normally interactive. Conversant. Range of motion left hip relatively normal Right hip has very limited abduction Crepitus of both knees   Assessment and Plan: Physical exam  Mixed hyperlipidemia - Plan: Lipid panel, CANCELED: Lipid panel  Screening for diabetes mellitus - Plan: Comprehensive metabolic panel, Hemoglobin A1c, CANCELED: Comprehensive metabolic panel, CANCELED: Hemoglobin A1c  Essential hypertension - Plan: CBC, Comprehensive metabolic panel, CANCELED: CBC, CANCELED: Comprehensive metabolic panel  Screening for prostate cancer - Plan: PSA, CANCELED: PSA  Screening for HIV (human immunodeficiency virus) - Plan: HIV Antibody (routine testing w rflx)  Encounter for hepatitis C screening test for low risk patient - Plan: Hepatitis C antibody  Bilateral hip pain - Plan: Ambulatory referral to Orthopedic Surgery  Lumbar strain, initial encounter - Plan: cyclobenzaprine (FLEXERIL) 10 MG tablet  Hyperlipidemia - Plan: simvastatin (ZOCOR) 20 MG tablet  Prediabetes  Pt here today for a follow-up visit/ CPE Will plan further follow- up pending labs. Referral to orthopedics to discuss his knee and hip issues Refill Flexeril which he uses as needed for back pain Encouraged continued exercise, healthy diet He is  maintaining his weight through intermittent fasting This visit occurred during the SARS-CoV-2 public health emergency.  Safety protocols were in place, including screening questions prior to the visit, additional usage of staff PPE, and extensive cleaning of exam room while observing appropriate contact time as indicated for disinfecting solutions.    Signed Lamar Blinks, MD  Received labs 8/26- message to pt A1c is improved   Results for orders placed or performed in visit on 01/23/20  Hepatitis C antibody  Result Value Ref Range   Hepatitis C Ab NON-REACTIVE NON-REACTI   SIGNAL TO CUT-OFF 0.02 <1.00  HIV Antibody (routine testing w rflx)  Result Value Ref Range   HIV 1&2 Ab, 4th Generation NON-REACTIVE NON-REACTI  CBC  Result Value Ref Range   WBC 7.2 3.8 - 10.8 Thousand/uL   RBC 4.48 4.20 - 5.80 Million/uL  Hemoglobin 14.7 13.2 - 17.1 g/dL   HCT 42.6 38 - 50 %   MCV 95.1 80.0 - 100.0 fL   MCH 32.8 27.0 - 33.0 pg   MCHC 34.5 32.0 - 36.0 g/dL   RDW 12.3 11.0 - 15.0 %   Platelets 248 140 - 400 Thousand/uL   MPV 9.8 7.5 - 12.5 fL  Comprehensive metabolic panel  Result Value Ref Range   Glucose, Bld 97 65 - 99 mg/dL   BUN 14 7 - 25 mg/dL   Creat 0.97 0.70 - 1.33 mg/dL   BUN/Creatinine Ratio NOT APPLICABLE 6 - 22 (calc)   Sodium 140 135 - 146 mmol/L   Potassium 4.2 3.5 - 5.3 mmol/L   Chloride 102 98 - 110 mmol/L   CO2 26 20 - 32 mmol/L   Calcium 9.7 8.6 - 10.3 mg/dL   Total Protein 7.2 6.1 - 8.1 g/dL   Albumin 4.7 3.6 - 5.1 g/dL   Globulin 2.5 1.9 - 3.7 g/dL (calc)   AG Ratio 1.9 1.0 - 2.5 (calc)   Total Bilirubin 0.6 0.2 - 1.2 mg/dL   Alkaline phosphatase (APISO) 64 35 - 144 U/L   AST 20 10 - 35 U/L   ALT 24 9 - 46 U/L  Hemoglobin A1c  Result Value Ref Range   Hgb A1c MFr Bld 5.7 (H) <5.7 % of total Hgb   Mean Plasma Glucose 117 (calc)   eAG (mmol/L) 6.5 (calc)  Lipid panel  Result Value Ref Range   Cholesterol 167 <200 mg/dL   HDL 43 > OR = 40 mg/dL    Triglycerides 124 <150 mg/dL   LDL Cholesterol (Calc) 101 (H) mg/dL (calc)   Total CHOL/HDL Ratio 3.9 <5.0 (calc)   Non-HDL Cholesterol (Calc) 124 <130 mg/dL (calc)  PSA  Result Value Ref Range   PSA 1.0 < OR = 4.0 ng/mL    Blood counts are normal Metabolic profile looks fine Your A1c-average blood sugar-is still barely in the prediabetes range, but improved from previous  Your cholesterol is overall reasonable.  I calculated your estimated 10-year risk of cardiovascular disease as below:  The 10-year ASCVD risk score Mikey Bussing DC Brooke Bonito., et al., 2013) is: 5.5%   Values used to calculate the score:     Age: 78 years     Sex: Male     Is Non-Hispanic African American: No     Diabetic: No     Tobacco smoker: No     Systolic Blood Pressure: 098 mmHg     Is BP treated: Yes     HDL Cholesterol: 43 mg/dL     Total Cholesterol: 167 mg/dL  This risk to me is favorable.  We can consider a cholesterol medication if you would like, or continue to monitor at this time  Lab Results  Component Value Date   PSA 1.0 01/23/2020   PSA 0.99 09/28/2018   PSA 1.18 06/23/2017   PSA looks great, stable from years previous Keep up the good work, please see me in about 6 months to monitor blood pressure

## 2020-01-23 ENCOUNTER — Encounter: Payer: Self-pay | Admitting: Family Medicine

## 2020-01-23 ENCOUNTER — Ambulatory Visit (INDEPENDENT_AMBULATORY_CARE_PROVIDER_SITE_OTHER): Payer: BC Managed Care – PPO | Admitting: Family Medicine

## 2020-01-23 ENCOUNTER — Other Ambulatory Visit: Payer: Self-pay

## 2020-01-23 VITALS — BP 128/82 | HR 67 | Temp 98.6°F | Resp 17 | Ht 72.0 in | Wt 244.0 lb

## 2020-01-23 DIAGNOSIS — Z125 Encounter for screening for malignant neoplasm of prostate: Secondary | ICD-10-CM

## 2020-01-23 DIAGNOSIS — M25551 Pain in right hip: Secondary | ICD-10-CM

## 2020-01-23 DIAGNOSIS — Z0000 Encounter for general adult medical examination without abnormal findings: Secondary | ICD-10-CM

## 2020-01-23 DIAGNOSIS — R7303 Prediabetes: Secondary | ICD-10-CM

## 2020-01-23 DIAGNOSIS — S39012A Strain of muscle, fascia and tendon of lower back, initial encounter: Secondary | ICD-10-CM

## 2020-01-23 DIAGNOSIS — Z131 Encounter for screening for diabetes mellitus: Secondary | ICD-10-CM | POA: Diagnosis not present

## 2020-01-23 DIAGNOSIS — E782 Mixed hyperlipidemia: Secondary | ICD-10-CM

## 2020-01-23 DIAGNOSIS — I10 Essential (primary) hypertension: Secondary | ICD-10-CM | POA: Diagnosis not present

## 2020-01-23 DIAGNOSIS — M25552 Pain in left hip: Secondary | ICD-10-CM

## 2020-01-23 DIAGNOSIS — Z1159 Encounter for screening for other viral diseases: Secondary | ICD-10-CM | POA: Diagnosis not present

## 2020-01-23 DIAGNOSIS — Z114 Encounter for screening for human immunodeficiency virus [HIV]: Secondary | ICD-10-CM

## 2020-01-23 DIAGNOSIS — E785 Hyperlipidemia, unspecified: Secondary | ICD-10-CM

## 2020-01-23 MED ORDER — SIMVASTATIN 20 MG PO TABS: ORAL_TABLET | ORAL | 3 refills | Status: DC

## 2020-01-23 MED ORDER — CYCLOBENZAPRINE HCL 10 MG PO TABS: 10.0000 mg | ORAL_TABLET | Freq: Two times a day (BID) | ORAL | 0 refills | Status: DC | PRN

## 2020-01-24 ENCOUNTER — Encounter: Payer: Self-pay | Admitting: Family Medicine

## 2020-01-24 DIAGNOSIS — R7303 Prediabetes: Secondary | ICD-10-CM | POA: Insufficient documentation

## 2020-01-24 LAB — HEMOGLOBIN A1C
Hgb A1c MFr Bld: 5.7 % of total Hgb — ABNORMAL HIGH (ref <5.7)
Mean Plasma Glucose: 117 (calc)
eAG (mmol/L): 6.5 (calc)

## 2020-01-24 LAB — COMPREHENSIVE METABOLIC PANEL
AG Ratio: 1.9 (calc) (ref 1.0–2.5)
ALT: 24 U/L (ref 9–46)
AST: 20 U/L (ref 10–35)
Albumin: 4.7 g/dL (ref 3.6–5.1)
Alkaline phosphatase (APISO): 64 U/L (ref 35–144)
BUN: 14 mg/dL (ref 7–25)
CO2: 26 mmol/L (ref 20–32)
Calcium: 9.7 mg/dL (ref 8.6–10.3)
Chloride: 102 mmol/L (ref 98–110)
Creat: 0.97 mg/dL (ref 0.70–1.33)
Globulin: 2.5 g/dL (calc) (ref 1.9–3.7)
Glucose, Bld: 97 mg/dL (ref 65–99)
Potassium: 4.2 mmol/L (ref 3.5–5.3)
Sodium: 140 mmol/L (ref 135–146)
Total Bilirubin: 0.6 mg/dL (ref 0.2–1.2)
Total Protein: 7.2 g/dL (ref 6.1–8.1)

## 2020-01-24 LAB — LIPID PANEL
Cholesterol: 167 mg/dL (ref <200)
HDL: 43 mg/dL (ref >=40)
LDL Cholesterol (Calc): 101 mg/dL (calc) — ABNORMAL HIGH
Non-HDL Cholesterol (Calc): 124 mg/dL (calc) (ref <130)
Total CHOL/HDL Ratio: 3.9 (calc) (ref <5.0)
Triglycerides: 124 mg/dL (ref <150)

## 2020-01-24 LAB — CBC
HCT: 42.6 % (ref 38.5–50.0)
Hemoglobin: 14.7 g/dL (ref 13.2–17.1)
MCH: 32.8 pg (ref 27.0–33.0)
MCHC: 34.5 g/dL (ref 32.0–36.0)
MCV: 95.1 fL (ref 80.0–100.0)
MPV: 9.8 fL (ref 7.5–12.5)
Platelets: 248 10*3/uL (ref 140–400)
RBC: 4.48 10*6/uL (ref 4.20–5.80)
RDW: 12.3 % (ref 11.0–15.0)
WBC: 7.2 10*3/uL (ref 3.8–10.8)

## 2020-01-24 LAB — HIV ANTIBODY (ROUTINE TESTING W REFLEX): HIV 1&2 Ab, 4th Generation: NONREACTIVE

## 2020-01-24 LAB — HEPATITIS C ANTIBODY
Hepatitis C Ab: NONREACTIVE
SIGNAL TO CUT-OFF: 0.02 (ref <1.00)

## 2020-01-24 LAB — PSA: PSA: 1 ng/mL (ref <=4.0)

## 2020-01-25 ENCOUNTER — Encounter: Payer: Self-pay | Admitting: Family Medicine

## 2020-02-05 ENCOUNTER — Ambulatory Visit (INDEPENDENT_AMBULATORY_CARE_PROVIDER_SITE_OTHER): Payer: BC Managed Care – PPO | Admitting: Orthopaedic Surgery

## 2020-02-05 ENCOUNTER — Ambulatory Visit: Payer: Self-pay

## 2020-02-05 ENCOUNTER — Encounter: Payer: Self-pay | Admitting: Orthopaedic Surgery

## 2020-02-05 VITALS — Ht 73.5 in | Wt 247.4 lb

## 2020-02-05 DIAGNOSIS — M1611 Unilateral primary osteoarthritis, right hip: Secondary | ICD-10-CM

## 2020-02-05 DIAGNOSIS — G8929 Other chronic pain: Secondary | ICD-10-CM | POA: Diagnosis not present

## 2020-02-05 DIAGNOSIS — M1612 Unilateral primary osteoarthritis, left hip: Secondary | ICD-10-CM

## 2020-02-05 DIAGNOSIS — M5442 Lumbago with sciatica, left side: Secondary | ICD-10-CM | POA: Diagnosis not present

## 2020-02-05 NOTE — Progress Notes (Signed)
Office Visit Note   Patient: Gabriel Oconnell           Date of Birth: 02-03-1966           MRN: [id number] Visit Date: 02/05/2020              Requested by: Darreld Mclean, MD [address] [place],  [place] [address] PCP: Darreld Mclean, MD   Assessment & Plan: Visit Diagnoses:  1. Chronic left-sided low back pain with left-sided sciatica   2. Primary osteoarthritis of right hip   3. Primary osteoarthritis of left hip     Plan: Impression is advanced bilateral hip DJD worse on the left.  We had a lengthy discussion on treatment options that include oral medications, intra-articular cortisone injections, total hip replacement.  Given the severity of his disease unfortunately I feel that nonoperative treatments are unlikely going to give him any long-lasting relief or improvement in quality of life.  He will take time to think about his options and discuss this with his wife and will let us know how he wants to proceed.  He understands that the current restriction due to Covid at the main hospital is a same-day surgery.  Follow-Up Instructions: Return if symptoms worsen or fail to improve.   Orders:  Orders Placed This Encounter  Procedures  . XR Lumbar Spine 2-3 Views  . XR HIPS BILAT W OR W/O PELVIS 3-4 VIEWS   No orders of the defined types were placed in this encounter.     Procedures: No procedures performed   Clinical Data: No additional findings.   Subjective: Chief Complaint  Patient presents with  . Left Hip - Pain  . Right Hip - Pain  . Lower Back - Pain    Gabriel Oconnell is a 54 year old gentleman who is here for mainly bilateral hip and groin pain worse on the left.  His father had a hip replacement in his 36s and 20s and Gabriel Oconnell has noticed it within the last year that his hips have become severe in terms of pain and dysfunction.  He can no longer lift his leg up to get onto his motorcycle.  He cannot tie his shoelaces or put on his shoes or socks  on his left foot.  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  He is currently not taking any medications.  He is here to explore his options.   Review of Systems  Constitutional: Negative.   All other systems reviewed and are negative.    Objective: Vital Signs: Ht 6' 1.5" (1.867 m)   Wt 247 lb 6.4 oz (112.2 kg)   BMI 32.20 kg/m   Physical Exam Vitals and nursing note reviewed.  Constitutional:      Appearance: He is well-developed.  HENT:     Head: Normocephalic and atraumatic.  Eyes:     Pupils: Pupils are equal, round, and reactive to light.  Pulmonary:     Effort: Pulmonary effort is normal.  Abdominal:     Palpations: Abdomen is soft.  Musculoskeletal:        General: Normal range of motion.     Cervical back: Neck supple.  Skin:    General: Skin is warm.  Neurological:     Mental Status: He is alert and oriented to person, place, and time.  Psychiatric:        Behavior: Behavior normal.        Thought Content: Thought content normal.  Judgment: Judgment normal.     Ortho Exam Bilateral hips show very limited range of motion both internal and external rotation.  Positive Stinchfield and positive logroll. Specialty Comments:  No specialty comments available.  Imaging: XR HIPS BILAT W OR W/O PELVIS 3-4 VIEWS  Result Date: 02/05/2020 Advanced bilateral hip DJD slightly worse on the left.  XR Lumbar Spine 2-3 Views  Result Date: 02/05/2020 Degenerative spondylosis and spondylolisthesis.  No acute abnormalities.    PMFS History: Patient Active Problem List   Diagnosis Date Noted  . Chronic left-sided low back pain with left-sided sciatica 02/05/2020  . Primary osteoarthritis of left hip 02/05/2020  . Prediabetes 01/24/2020  . Obesity 07/03/2015  . Right foot pain 04/30/2015  . Left foot pain 06/27/2014  . Vestibular schwannoma ([place]) 12/06/2012  . Ringing in right ear 10/12/2012  . Hip pain 07/07/2012  . Family hx of colon cancer  09/29/2010  . DANDRUFF 08/07/2008  . LIPOMA 03/25/2008  . Hyperlipidemia 08/24/2006  . Essential hypertension 08/24/2006   Past Medical History:  Diagnosis Date  . Acoustic neuroma ([place])   . Hyperlipidemia   . Hypertension     Family History  Problem Relation Age of Onset  . Pancreatic cancer Father   . Colon polyps Father   . Colon cancer Father        late 72's  . Colon cancer Paternal Grandmother        early 51's  . Heart disease Mother     Past Surgical History:  Procedure Laterality Date  . KNEE SURGERY     left   Social History   Occupational History  . Not on file  Tobacco Use  . Smoking status: Never Smoker  . Smokeless tobacco: Never Used  Substance and Sexual Activity  . Alcohol use: Yes    Alcohol/week: 2.0 standard drinks    Types: 2 Standard drinks or equivalent per week    Comment: 2-3 drinks per week per pt  . Drug use: No  . Sexual activity: Not on file

## 2020-02-09 ENCOUNTER — Encounter: Payer: Self-pay | Admitting: Orthopaedic Surgery

## 2020-02-09 ENCOUNTER — Encounter: Payer: Self-pay | Admitting: Family Medicine

## 2020-03-12 ENCOUNTER — Encounter: Payer: Self-pay | Admitting: Orthopaedic Surgery

## 2020-03-25 ENCOUNTER — Encounter: Payer: Self-pay | Admitting: Family Medicine

## 2020-04-17 ENCOUNTER — Encounter: Payer: Self-pay | Admitting: Family Medicine

## 2020-04-28 ENCOUNTER — Encounter: Payer: Self-pay | Admitting: Orthopaedic Surgery

## 2020-05-13 ENCOUNTER — Telehealth: Payer: Self-pay | Admitting: Orthopaedic Surgery

## 2020-05-13 NOTE — Telephone Encounter (Signed)
Spoke with patient concerning disability forms and [place]    Patient said he will call me back tomorrow for the information because he was driving and couldn't write the information down.

## 2020-05-22 ENCOUNTER — Telehealth: Payer: Self-pay | Admitting: Orthopaedic Surgery

## 2020-05-22 NOTE — Telephone Encounter (Signed)
Received $25.00 cash and FMLA/Disability form from patient   Forwarding to [employer] today

## 2020-06-03 ENCOUNTER — Other Ambulatory Visit: Payer: Self-pay

## 2020-06-09 ENCOUNTER — Other Ambulatory Visit: Payer: Self-pay

## 2020-06-09 ENCOUNTER — Encounter: Payer: Self-pay | Admitting: Orthopaedic Surgery

## 2020-06-09 ENCOUNTER — Encounter (HOSPITAL_COMMUNITY): Payer: Self-pay

## 2020-06-09 ENCOUNTER — Encounter (HOSPITAL_COMMUNITY)
Admission: RE | Admit: 2020-06-09 | Discharge: 2020-06-09 | Disposition: A | Discharge status: Home / Self Care | Payer: BC Managed Care – PPO | Source: Ambulatory Visit | Attending: Orthopaedic Surgery | Admitting: Orthopaedic Surgery

## 2020-06-09 ENCOUNTER — Other Ambulatory Visit (HOSPITAL_COMMUNITY): Payer: BC Managed Care – PPO

## 2020-06-09 ENCOUNTER — Ambulatory Visit (HOSPITAL_COMMUNITY)
Admission: RE | Admit: 2020-06-09 | Discharge: 2020-06-09 | Disposition: A | Discharge status: Home / Self Care | Payer: BC Managed Care – PPO | Source: Ambulatory Visit | Attending: Physician Assistant | Admitting: Physician Assistant

## 2020-06-09 DIAGNOSIS — M1612 Unilateral primary osteoarthritis, left hip: Secondary | ICD-10-CM

## 2020-06-09 HISTORY — DX: Unspecified hearing loss, unspecified ear: H91.90

## 2020-06-09 LAB — CBC WITH DIFFERENTIAL/PLATELET
Abs Immature Granulocytes: 0.02 10*3/uL (ref 0.00–0.07)
Basophils Absolute: 0 10*3/uL (ref 0.0–0.1)
Basophils Relative: 0 %
Eosinophils Absolute: 0.2 10*3/uL (ref 0.0–0.5)
Eosinophils Relative: 2 %
HCT: 43.6 % (ref 39.0–52.0)
Hemoglobin: 15.1 g/dL (ref 13.0–17.0)
Immature Granulocytes: 0 %
Lymphocytes Relative: 30 %
Lymphs Abs: 2.7 10*3/uL (ref 0.7–4.0)
MCH: 33.1 pg (ref 26.0–34.0)
MCHC: 34.6 g/dL (ref 30.0–36.0)
MCV: 95.6 fL (ref 80.0–100.0)
Monocytes Absolute: 0.8 10*3/uL (ref 0.1–1.0)
Monocytes Relative: 9 %
Neutro Abs: 5.4 10*3/uL (ref 1.7–7.7)
Neutrophils Relative %: 59 %
Platelets: 287 10*3/uL (ref 150–400)
RBC: 4.56 MIL/uL (ref 4.22–5.81)
RDW: 13 % (ref 11.5–15.5)
WBC: 9.2 10*3/uL (ref 4.0–10.5)
nRBC: 0 % (ref 0.0–0.2)

## 2020-06-09 LAB — URINALYSIS, ROUTINE W REFLEX MICROSCOPIC
Bilirubin Urine: NEGATIVE
Glucose, UA: NEGATIVE mg/dL
Hgb urine dipstick: NEGATIVE
Ketones, ur: NEGATIVE mg/dL
Leukocytes,Ua: NEGATIVE
Nitrite: NEGATIVE
Protein, ur: NEGATIVE mg/dL
Specific Gravity, Urine: 1.021 (ref 1.005–1.030)
pH: 5 (ref 5.0–8.0)

## 2020-06-09 LAB — COMPREHENSIVE METABOLIC PANEL
ALT: 30 U/L (ref 0–44)
AST: 25 U/L (ref 15–41)
Albumin: 4.5 g/dL (ref 3.5–5.0)
Alkaline Phosphatase: 54 U/L (ref 38–126)
Anion gap: 13 (ref 5–15)
BUN: 12 mg/dL (ref 6–20)
CO2: 26 mmol/L (ref 22–32)
Calcium: 9.7 mg/dL (ref 8.9–10.3)
Chloride: 100 mmol/L (ref 98–111)
Creatinine, Ser: 0.95 mg/dL (ref 0.61–1.24)
GFR, Estimated: >60 mL/min (ref >60)
Glucose, Bld: 101 mg/dL — ABNORMAL HIGH (ref 70–99)
Potassium: 3.5 mmol/L (ref 3.5–5.1)
Sodium: 139 mmol/L (ref 135–145)
Total Bilirubin: 0.9 mg/dL (ref 0.3–1.2)
Total Protein: 7.5 g/dL (ref 6.5–8.1)

## 2020-06-09 LAB — SURGICAL PCR SCREEN
MRSA, PCR: NEGATIVE
Staphylococcus aureus: NEGATIVE

## 2020-06-09 LAB — PROTIME-INR
INR: 1 (ref 0.8–1.2)
Prothrombin Time: 12.4 seconds (ref 11.4–15.2)

## 2020-06-09 LAB — APTT: aPTT: 32 seconds (ref 24–36)

## 2020-06-09 NOTE — Progress Notes (Signed)
PCP - Dr Silvestre Mesi Cardiologist - n/a  Chest x-ray - 06/09/20 EKG - 06/09/20 Stress Test - n/a ECHO - n/a Cardiac Cath - n/a  Aspirin Instructions: Follow your surgeon's instructions on when to stop aspirin prior to surgery,  If no instructions were given by your surgeon then you will need to call the office for those instructions.    ERAS: Clear liquids til 9 am day of surgery, drink/water given at PAT appt.  Anesthesia review: Yes  STOP now taking any Aspirin (unless otherwise instructed by your surgeon), Aleve, Naproxen, Ibuprofen, Motrin, Advil, Goody's, BC's, all herbal medications, fish oil, and all vitamins.   Coronavirus Screening Covid test is scheduled for 06/11/20 Do you have any of the following symptoms:  Cough yes/no: No Fever (>100.63F)  yes/no: No Runny nose yes/no: No Sore throat yes/no: No Difficulty breathing/shortness of breath  yes/no: No  Have you traveled in the last 14 days and where? yes/no: No  Patient verbalized understanding of instructions that were given to them at the PAT appointment.

## 2020-06-09 NOTE — Progress Notes (Signed)
Your procedure is scheduled on Friday Jun 13, 2020.  Report to [facility] Main Entrance, [place] at 9:30 A.M., and check in at the Admitting office.  Call this number if you have problems the morning of surgery: [phone]   Remember: Do not eat after midnight the night before your surgery - Thursday  You may drink clear liquids until 09:00 A.M. the morning of your surgery.   Clear liquids allowed are: Water, Non-Citrus Juices (without pulp), Carbonated Beverages, Clear Tea, Black Coffee Only, and Gatorade  Please complete your PRE-SURGERY ENSURE that was provided to you by 09:00 A.M. the morning of your surgery.   Please, if able, drink it in one setting. DO NOT SIP.    Take these medicines the morning of surgery with A SIP OF WATER: amLODipine (NORVASC) simvastatin (ZOCOR)  If needed: cyclobenzaprine (FLEXERIL)  Follow your surgeon's instructions on when to stop Aspirin.  If no instructions were given by your surgeon then you will need to call the office to get those instructions.    As of today, STOP taking any Aleve, Naproxen, Ibuprofen, Motrin, Advil, Goody's, BC's, all herbal medications, fish oil, and all vitamins.    The Morning of Surgery  Do not wear jewelry  Do not wear lotions, powders, colognes, or deodorant Men may shave face and neck.  Do not bring valuables to the hospital.  [facility] is not responsible for any belongings or valuables.  If you are a smoker, DO NOT Smoke 24 hours prior to surgery  If you wear a CPAP at night please bring your mask the morning of surgery   Remember that you must have someone to transport you home after your surgery, and remain with you for 24 hours if you are discharged the same day.   Please bring cases for contacts, glasses, hearing aids, dentures or bridgework because it cannot be worn into surgery.    Leave your suitcase in the car.  After surgery it may be brought to your room.  For  patients admitted to the hospital, discharge time will be determined by your treatment team.  Patients discharged the day of surgery will not be allowed to drive home.    Special instructions:   [place]- Preparing For Surgery  Before surgery, you can play an important role. Because skin is not sterile, your skin needs to be as free of germs as possible. You can reduce the number of germs on your skin by washing with CHG (chlorahexidine gluconate) Soap before surgery.  CHG is an antiseptic cleaner which kills germs and bonds with the skin to continue killing germs even after washing.    Oral Hygiene is also important to reduce your risk of infection.  Remember - BRUSH YOUR TEETH THE MORNING OF SURGERY WITH YOUR REGULAR TOOTHPASTE  Please do not use if you have an allergy to CHG or antibacterial soaps. If your skin becomes reddened/irritated stop using the CHG.  Do not shave (including legs and underarms) for at least 48 hours prior to first CHG shower. It is OK to shave your face.  Please follow these instructions carefully.   1. Shower the NIGHT BEFORE SURGERY Thurs and the MORNING OF SURGERY Fri with CHG Soap.   2. If you chose to wash your hair and body, wash as usual with your normal shampoo and body-wash/soap.  3. Rinse your hair and body thoroughly to remove the shampoo and soap.  4. Apply CHG directly to the skin (  ONLY FROM THE NECK DOWN) and wash gently with a scrungie or a clean washcloth.   5. Do not use on open wounds or open sores. Avoid contact with your eyes, ears, mouth and genitals (private parts). Wash Face and genitals (private parts)  with your normal soap.   6. Wash thoroughly, paying special attention to the area where your surgery will be performed.  7. Thoroughly rinse your body with warm water from the neck down.  8. DO NOT shower/wash with your normal soap after using and rinsing off the CHG Soap.  9. Pat yourself dry with a CLEAN TOWEL.  10. Wear CLEAN  PAJAMAS to bed the night before surgery  11. Place CLEAN SHEETS on your bed the night of your first shower and DO NOT SLEEP WITH PETS.  12. Wear comfortable clothes the morning of surgery.     Day of Surgery:  Please shower the morning of surgery with the CHG soap Do not apply any deodorants/lotions. Please wear clean clothes to the hospital/surgery center.   Remember to brush your teeth WITH YOUR REGULAR TOOTHPASTE.   Please read over the following fact sheets that you were given.

## 2020-06-09 NOTE — Progress Notes (Signed)
Your procedure is scheduled on Friday Jun 13, 2020.  Report to [facility] Main Entrance "A" at 10:00 A.M., and check in at the Admitting office.  Call this number if you have problems the morning of surgery: [phone]  Call [phone] if you have any questions prior to your surgery date Monday-Friday 8am-4pm   Remember: Do not eat after midnight the night before your surgery  You may drink clear liquids until 09:00 A.M. the morning of your surgery.   Clear liquids allowed are: Water, Non-Citrus Juices (without pulp), Carbonated Beverages, Clear Tea, Black Coffee Only, and Gatorade  Please complete your PRE-SURGERY ENSURE that was provided to you by 09:00 A.M. the morning of your surgery.   Please, if able, drink it in one setting. DO NOT SIP.    Take these medicines the morning of surgery with A SIP OF WATER: amLODipine (NORVASC) simvastatin (ZOCOR)  If needed: cyclobenzaprine (FLEXERIL)  Follow your surgeon's instructions on when to stop Aspirin.  If no instructions were given by your surgeon then you will need to call the office to get those instructions.    As of today, STOP taking any Aleve, Naproxen, Ibuprofen, Motrin, Advil, Goody's, BC's, all herbal medications, fish oil, and all vitamins.    The Morning of Surgery  Do not wear jewelry  Do not wear lotions, powders, colognes, or deodorant Men may shave face and neck.  Do not bring valuables to the hospital.  [facility] is not responsible for any belongings or valuables.  If you are a smoker, DO NOT Smoke 24 hours prior to surgery  If you wear a CPAP at night please bring your mask the morning of surgery   Remember that you must have someone to transport you home after your surgery, and remain with you for 24 hours if you are discharged the same day.   Please bring cases for contacts, glasses, hearing aids, dentures or bridgework because it cannot be worn into surgery.    Leave your suitcase in the car.   After surgery it may be brought to your room.  For patients admitted to the hospital, discharge time will be determined by your treatment team.  Patients discharged the day of surgery will not be allowed to drive home.    Special instructions:   [place]- Preparing For Surgery  Before surgery, you can play an important role. Because skin is not sterile, your skin needs to be as free of germs as possible. You can reduce the number of germs on your skin by washing with CHG (chlorahexidine gluconate) Soap before surgery.  CHG is an antiseptic cleaner which kills germs and bonds with the skin to continue killing germs even after washing.    Oral Hygiene is also important to reduce your risk of infection.  Remember - BRUSH YOUR TEETH THE MORNING OF SURGERY WITH YOUR REGULAR TOOTHPASTE  Please do not use if you have an allergy to CHG or antibacterial soaps. If your skin becomes reddened/irritated stop using the CHG.  Do not shave (including legs and underarms) for at least 48 hours prior to first CHG shower. It is OK to shave your face.  Please follow these instructions carefully.   1. Shower the NIGHT BEFORE SURGERY and the MORNING OF SURGERY with CHG Soap.   2. If you chose to wash your hair and body, wash as usual with your normal shampoo and body-wash/soap.  3. Rinse your hair and body thoroughly to remove the shampoo and soap.  4. Apply CHG directly to the skin (ONLY FROM THE NECK DOWN) and wash gently with a scrungie or a clean washcloth.   5. Do not use on open wounds or open sores. Avoid contact with your eyes, ears, mouth and genitals (private parts). Wash Face and genitals (private parts)  with your normal soap.   6. Wash thoroughly, paying special attention to the area where your surgery will be performed.  7. Thoroughly rinse your body with warm water from the neck down.  8. DO NOT shower/wash with your normal soap after using and rinsing off the CHG Soap.  9. Pat yourself  dry with a CLEAN TOWEL.  10. Wear CLEAN PAJAMAS to bed the night before surgery  11. Place CLEAN SHEETS on your bed the night of your first shower and DO NOT SLEEP WITH PETS.  12. Wear comfortable clothes the morning of surgery.     Day of Surgery:  Please shower the morning of surgery with the CHG soap Do not apply any deodorants/lotions. Please wear clean clothes to the hospital/surgery center.   Remember to brush your teeth WITH YOUR REGULAR TOOTHPASTE.   Please read over the following fact sheets that you were given.

## 2020-06-10 ENCOUNTER — Other Ambulatory Visit: Payer: Self-pay | Admitting: Physician Assistant

## 2020-06-10 MED ORDER — DOCUSATE SODIUM 100 MG PO CAPS: 100.0000 mg | ORAL_CAPSULE | Freq: Every day | ORAL | 2 refills | Status: DC | PRN

## 2020-06-10 MED ORDER — ONDANSETRON HCL 4 MG PO TABS: 4.0000 mg | ORAL_TABLET | Freq: Three times a day (TID) | ORAL | 0 refills | Status: DC | PRN

## 2020-06-10 MED ORDER — METHOCARBAMOL 500 MG PO TABS: 500.0000 mg | ORAL_TABLET | Freq: Two times a day (BID) | ORAL | 0 refills | Status: DC | PRN

## 2020-06-10 MED ORDER — ASPIRIN EC 81 MG PO TBEC: 81.0000 mg | DELAYED_RELEASE_TABLET | Freq: Two times a day (BID) | ORAL | 0 refills | Status: DC

## 2020-06-10 MED ORDER — OXYCODONE-ACETAMINOPHEN 5-325 MG PO TABS
1.0000 | ORAL_TABLET | Freq: Four times a day (QID) | ORAL | 0 refills | Status: DC | PRN
Start: 2020-06-10 — End: 2021-01-26

## 2020-06-11 ENCOUNTER — Other Ambulatory Visit (HOSPITAL_COMMUNITY)
Admission: RE | Admit: 2020-06-11 | Discharge: 2020-06-11 | Disposition: A | Discharge status: Home / Self Care | Payer: BC Managed Care – PPO | Source: Ambulatory Visit | Attending: Orthopaedic Surgery | Admitting: Orthopaedic Surgery

## 2020-06-11 DIAGNOSIS — Z01812 Encounter for preprocedural laboratory examination: Secondary | ICD-10-CM | POA: Diagnosis not present

## 2020-06-11 DIAGNOSIS — Z20822 Contact with and (suspected) exposure to covid-19: Secondary | ICD-10-CM | POA: Diagnosis not present

## 2020-06-11 LAB — SARS CORONAVIRUS 2 (TAT 6-24 HRS): SARS Coronavirus 2: NEGATIVE

## 2020-06-12 MED ORDER — BUPIVACAINE LIPOSOME 1.3 % IJ SUSP
10.0000 mL | INTRAMUSCULAR | Status: DC
  Filled 2020-06-12: qty 10

## 2020-06-12 MED ORDER — TRANEXAMIC ACID 1000 MG/10ML IV SOLN
2000.0000 mg | INTRAVENOUS | Status: DC
  Filled 2020-06-12 (×2): qty 20

## 2020-06-13 ENCOUNTER — Observation Stay (HOSPITAL_COMMUNITY)
Admission: RE | Admit: 2020-06-13 | Discharge: 2020-06-14 | Disposition: A | Discharge status: Home / Self Care | Payer: BC Managed Care – PPO | Attending: Orthopaedic Surgery | Admitting: Orthopaedic Surgery

## 2020-06-13 ENCOUNTER — Ambulatory Visit (HOSPITAL_COMMUNITY): Payer: BC Managed Care – PPO | Admitting: Vascular Surgery

## 2020-06-13 ENCOUNTER — Observation Stay (HOSPITAL_COMMUNITY): Payer: BC Managed Care – PPO

## 2020-06-13 ENCOUNTER — Encounter (HOSPITAL_COMMUNITY): Payer: Self-pay | Admitting: Orthopaedic Surgery

## 2020-06-13 ENCOUNTER — Ambulatory Visit (HOSPITAL_COMMUNITY): Payer: BC Managed Care – PPO

## 2020-06-13 ENCOUNTER — Encounter (HOSPITAL_COMMUNITY)
Admission: RE | Disposition: A | Discharge status: Home / Self Care | Payer: Self-pay | Source: Home / Self Care | Attending: Orthopaedic Surgery

## 2020-06-13 ENCOUNTER — Ambulatory Visit (HOSPITAL_COMMUNITY): Payer: BC Managed Care – PPO | Admitting: Anesthesiology

## 2020-06-13 ENCOUNTER — Other Ambulatory Visit: Payer: Self-pay

## 2020-06-13 DIAGNOSIS — Z7982 Long term (current) use of aspirin: Secondary | ICD-10-CM | POA: Diagnosis not present

## 2020-06-13 DIAGNOSIS — Z96642 Presence of left artificial hip joint: Secondary | ICD-10-CM | POA: Diagnosis not present

## 2020-06-13 DIAGNOSIS — Z79899 Other long term (current) drug therapy: Secondary | ICD-10-CM | POA: Diagnosis not present

## 2020-06-13 DIAGNOSIS — Z96649 Presence of unspecified artificial hip joint: Secondary | ICD-10-CM

## 2020-06-13 DIAGNOSIS — Z471 Aftercare following joint replacement surgery: Secondary | ICD-10-CM | POA: Diagnosis not present

## 2020-06-13 DIAGNOSIS — Z87891 Personal history of nicotine dependence: Secondary | ICD-10-CM | POA: Insufficient documentation

## 2020-06-13 DIAGNOSIS — M1612 Unilateral primary osteoarthritis, left hip: Secondary | ICD-10-CM | POA: Diagnosis not present

## 2020-06-13 DIAGNOSIS — M25552 Pain in left hip: Secondary | ICD-10-CM | POA: Diagnosis not present

## 2020-06-13 DIAGNOSIS — I10 Essential (primary) hypertension: Secondary | ICD-10-CM | POA: Diagnosis not present

## 2020-06-13 DIAGNOSIS — E785 Hyperlipidemia, unspecified: Secondary | ICD-10-CM | POA: Diagnosis not present

## 2020-06-13 DIAGNOSIS — R7303 Prediabetes: Secondary | ICD-10-CM | POA: Diagnosis not present

## 2020-06-13 HISTORY — PX: TOTAL HIP ARTHROPLASTY: SHX124

## 2020-06-13 LAB — TYPE AND SCREEN
ABO/RH(D): B NEG
Antibody Screen: NEGATIVE

## 2020-06-13 LAB — ABO/RH: ABO/RH(D): B NEG

## 2020-06-13 SURGERY — ARTHROPLASTY, HIP, TOTAL, ANTERIOR APPROACH
Anesthesia: Spinal | Site: Hip | Laterality: Left

## 2020-06-13 MED ORDER — BUPIVACAINE HCL 0.25 % IJ SOLN
INTRAMUSCULAR | Status: AC
  Filled 2020-06-13: qty 1

## 2020-06-13 MED ORDER — ONDANSETRON HCL 4 MG/2ML IJ SOLN
INTRAMUSCULAR | Status: DC | PRN
  Administered 2020-06-13: 4 mg via INTRAVENOUS

## 2020-06-13 MED ORDER — ISOPROPYL ALCOHOL 70 % SOLN
Status: DC | PRN
  Administered 2020-06-13: 1 via TOPICAL

## 2020-06-13 MED ORDER — DEXAMETHASONE SODIUM PHOSPHATE 10 MG/ML IJ SOLN
INTRAMUSCULAR | Status: AC
  Filled 2020-06-13: qty 1

## 2020-06-13 MED ORDER — PROPOFOL 500 MG/50ML IV EMUL
INTRAVENOUS | Status: AC
  Filled 2020-06-13: qty 50

## 2020-06-13 MED ORDER — IRBESARTAN 150 MG PO TABS
300.0000 mg | ORAL_TABLET | Freq: Every day | ORAL | Status: DC
  Administered 2020-06-14: 300 mg via ORAL
  Filled 2020-06-13: qty 2

## 2020-06-13 MED ORDER — CEFAZOLIN SODIUM-DEXTROSE 2-4 GM/100ML-% IV SOLN
2.0000 g | Freq: Four times a day (QID) | INTRAVENOUS | Status: AC
Start: 2020-06-13 — End: 2020-06-14
  Administered 2020-06-13 – 2020-06-14 (×2): 2 g via INTRAVENOUS
  Filled 2020-06-13 (×2): qty 100

## 2020-06-13 MED ORDER — ONDANSETRON HCL 4 MG/2ML IJ SOLN
INTRAMUSCULAR | Status: AC
  Filled 2020-06-13: qty 2

## 2020-06-13 MED ORDER — AMLODIPINE BESYLATE 10 MG PO TABS
10.0000 mg | ORAL_TABLET | Freq: Every day | ORAL | Status: DC
Start: 2020-06-14 — End: 2020-06-14
  Administered 2020-06-14: 10 mg via ORAL
  Filled 2020-06-13: qty 1

## 2020-06-13 MED ORDER — SODIUM CHLORIDE 0.9 % IV SOLN: INTRAVENOUS | Status: DC

## 2020-06-13 MED ORDER — ALUM & MAG HYDROXIDE-SIMETH 200-200-20 MG/5ML PO SUSP: 30.0000 mL | ORAL | Status: DC | PRN

## 2020-06-13 MED ORDER — TRANEXAMIC ACID-NACL 1000-0.7 MG/100ML-% IV SOLN
1000.0000 mg | INTRAVENOUS | Status: AC
  Administered 2020-06-13: 1000 mg via INTRAVENOUS
  Filled 2020-06-13: qty 100

## 2020-06-13 MED ORDER — PROPOFOL 10 MG/ML IV BOLUS
INTRAVENOUS | Status: AC
  Filled 2020-06-13: qty 20

## 2020-06-13 MED ORDER — CHLORHEXIDINE GLUCONATE 0.12 % MT SOLN: 15.0000 mL | Freq: Once | OROMUCOSAL | Status: DC

## 2020-06-13 MED ORDER — ONDANSETRON HCL 4 MG/2ML IJ SOLN
4.0000 mg | Freq: Four times a day (QID) | INTRAMUSCULAR | Status: DC | PRN
  Administered 2020-06-13: 4 mg via INTRAVENOUS
  Filled 2020-06-13: qty 2

## 2020-06-13 MED ORDER — FENTANYL CITRATE (PF) 100 MCG/2ML IJ SOLN
INTRAMUSCULAR | Status: DC | PRN
  Administered 2020-06-13: 100 ug via INTRAVENOUS

## 2020-06-13 MED ORDER — SORBITOL 70 % SOLN
30.0000 mL | Freq: Every day | Status: DC | PRN
  Filled 2020-06-13: qty 30

## 2020-06-13 MED ORDER — METOCLOPRAMIDE HCL 5 MG PO TABS: 5.0000 mg | ORAL_TABLET | Freq: Three times a day (TID) | ORAL | Status: DC | PRN

## 2020-06-13 MED ORDER — DIPHENHYDRAMINE HCL 12.5 MG/5ML PO ELIX: 25.0000 mg | ORAL_SOLUTION | ORAL | Status: DC | PRN

## 2020-06-13 MED ORDER — ORAL CARE MOUTH RINSE: 15.0000 mL | Freq: Once | OROMUCOSAL | Status: DC

## 2020-06-13 MED ORDER — SODIUM CHLORIDE 0.9 % IR SOLN
Status: DC | PRN
  Administered 2020-06-13: 1000 mL

## 2020-06-13 MED ORDER — BUPIVACAINE HCL (PF) 0.25 % IJ SOLN
INTRAMUSCULAR | Status: DC | PRN
  Administered 2020-06-13: 20 mL

## 2020-06-13 MED ORDER — BUPIVACAINE LIPOSOME 1.3 % IJ SUSP
INTRAMUSCULAR | Status: DC | PRN
  Administered 2020-06-13: 20 mL

## 2020-06-13 MED ORDER — SODIUM CHLORIDE (PF) 0.9 % IJ SOLN
INTRAMUSCULAR | Status: AC
  Filled 2020-06-13: qty 20

## 2020-06-13 MED ORDER — PHENYLEPHRINE 40 MCG/ML (10ML) SYRINGE FOR IV PUSH (FOR BLOOD PRESSURE SUPPORT)
PREFILLED_SYRINGE | INTRAVENOUS | Status: AC
  Filled 2020-06-13: qty 10

## 2020-06-13 MED ORDER — ONDANSETRON HCL 4 MG/2ML IJ SOLN
INTRAMUSCULAR | Status: AC
  Administered 2020-06-13: 4 mg
  Filled 2020-06-13: qty 2

## 2020-06-13 MED ORDER — METHOCARBAMOL 500 MG PO TABS: 500.0000 mg | ORAL_TABLET | Freq: Four times a day (QID) | ORAL | Status: DC | PRN

## 2020-06-13 MED ORDER — VANCOMYCIN HCL 1 G IV SOLR
INTRAVENOUS | Status: DC | PRN
  Administered 2020-06-13: 1000 mg

## 2020-06-13 MED ORDER — ONDANSETRON HCL 4 MG/2ML IJ SOLN
4.0000 mg | Freq: Once | INTRAMUSCULAR | Status: AC | PRN
  Administered 2020-06-13: 4 mg via INTRAVENOUS

## 2020-06-13 MED ORDER — LACTATED RINGERS IV SOLN: INTRAVENOUS | Status: DC

## 2020-06-13 MED ORDER — ACETAMINOPHEN 325 MG PO TABS: 325.0000 mg | ORAL_TABLET | Freq: Four times a day (QID) | ORAL | Status: DC | PRN

## 2020-06-13 MED ORDER — 0.9 % SODIUM CHLORIDE (POUR BTL) OPTIME
TOPICAL | Status: DC | PRN
  Administered 2020-06-13: 1000 mL

## 2020-06-13 MED ORDER — HYDROMORPHONE HCL 1 MG/ML IJ SOLN
INTRAMUSCULAR | Status: AC
  Filled 2020-06-13: qty 1

## 2020-06-13 MED ORDER — ACETAMINOPHEN 10 MG/ML IV SOLN
INTRAVENOUS | Status: AC
  Filled 2020-06-13: qty 100

## 2020-06-13 MED ORDER — DOCUSATE SODIUM 100 MG PO CAPS
100.0000 mg | ORAL_CAPSULE | Freq: Two times a day (BID) | ORAL | Status: DC
  Administered 2020-06-13 – 2020-06-14 (×2): 100 mg via ORAL
  Filled 2020-06-13 (×2): qty 1

## 2020-06-13 MED ORDER — VALSARTAN-HYDROCHLOROTHIAZIDE 320-12.5 MG PO TABS: 1.0000 | ORAL_TABLET | Freq: Every day | ORAL | Status: DC

## 2020-06-13 MED ORDER — CEFAZOLIN SODIUM-DEXTROSE 2-4 GM/100ML-% IV SOLN
2.0000 g | INTRAVENOUS | Status: AC
  Administered 2020-06-13: 2 g via INTRAVENOUS
  Filled 2020-06-13: qty 100

## 2020-06-13 MED ORDER — VANCOMYCIN HCL 1000 MG IV SOLR
INTRAVENOUS | Status: AC
  Filled 2020-06-13: qty 1000

## 2020-06-13 MED ORDER — OXYCODONE HCL 5 MG PO TABS: 5.0000 mg | ORAL_TABLET | ORAL | Status: DC | PRN

## 2020-06-13 MED ORDER — STERILE WATER FOR IRRIGATION IR SOLN
Status: DC | PRN
  Administered 2020-06-13: 2000 mL

## 2020-06-13 MED ORDER — HYDROMORPHONE HCL 1 MG/ML IJ SOLN
0.2500 mg | INTRAMUSCULAR | Status: DC | PRN
  Administered 2020-06-13 (×3): 0.5 mg via INTRAVENOUS

## 2020-06-13 MED ORDER — DEXAMETHASONE SODIUM PHOSPHATE 10 MG/ML IJ SOLN
10.0000 mg | Freq: Once | INTRAMUSCULAR | Status: AC
  Administered 2020-06-14: 10 mg via INTRAVENOUS
  Filled 2020-06-13: qty 1

## 2020-06-13 MED ORDER — HYDROMORPHONE HCL 1 MG/ML IJ SOLN: 0.5000 mg | INTRAMUSCULAR | Status: DC | PRN

## 2020-06-13 MED ORDER — OXYCODONE HCL 5 MG PO TABS: 10.0000 mg | ORAL_TABLET | ORAL | Status: DC | PRN

## 2020-06-13 MED ORDER — ACETAMINOPHEN 500 MG PO TABS
1000.0000 mg | ORAL_TABLET | Freq: Four times a day (QID) | ORAL | Status: DC
  Administered 2020-06-13 – 2020-06-14 (×2): 1000 mg via ORAL
  Filled 2020-06-13 (×2): qty 2

## 2020-06-13 MED ORDER — ISOPROPYL ALCOHOL 70 % SOLN
Status: AC
  Filled 2020-06-13: qty 480

## 2020-06-13 MED ORDER — DEXAMETHASONE SODIUM PHOSPHATE 10 MG/ML IJ SOLN
INTRAMUSCULAR | Status: DC | PRN
  Administered 2020-06-13: 10 mg via INTRAVENOUS

## 2020-06-13 MED ORDER — PROPOFOL 10 MG/ML IV BOLUS
INTRAVENOUS | Status: DC | PRN
  Administered 2020-06-13 (×5): 20 mg via INTRAVENOUS

## 2020-06-13 MED ORDER — BUPIVACAINE LIPOSOME 1.3 % IJ SUSP
20.0000 mL | Freq: Once | INTRAMUSCULAR | Status: DC
  Filled 2020-06-13: qty 20

## 2020-06-13 MED ORDER — PHENYLEPHRINE 40 MCG/ML (10ML) SYRINGE FOR IV PUSH (FOR BLOOD PRESSURE SUPPORT)
PREFILLED_SYRINGE | INTRAVENOUS | Status: DC | PRN
  Administered 2020-06-13 (×2): 80 ug via INTRAVENOUS

## 2020-06-13 MED ORDER — ASPIRIN 81 MG PO CHEW
81.0000 mg | CHEWABLE_TABLET | Freq: Two times a day (BID) | ORAL | Status: DC
  Administered 2020-06-13 – 2020-06-14 (×2): 81 mg via ORAL
  Filled 2020-06-13 (×2): qty 1

## 2020-06-13 MED ORDER — METHOCARBAMOL 1000 MG/10ML IJ SOLN
500.0000 mg | Freq: Four times a day (QID) | INTRAVENOUS | Status: DC | PRN
  Filled 2020-06-13: qty 5

## 2020-06-13 MED ORDER — METOCLOPRAMIDE HCL 5 MG/ML IJ SOLN
5.0000 mg | Freq: Three times a day (TID) | INTRAMUSCULAR | Status: DC | PRN
  Administered 2020-06-13: 10 mg via INTRAVENOUS
  Filled 2020-06-13: qty 2

## 2020-06-13 MED ORDER — PROPOFOL 500 MG/50ML IV EMUL
INTRAVENOUS | Status: DC | PRN
  Administered 2020-06-13: 75 ug/kg/min via INTRAVENOUS

## 2020-06-13 MED ORDER — MAGNESIUM CITRATE PO SOLN: 1.0000 | Freq: Once | ORAL | Status: DC | PRN

## 2020-06-13 MED ORDER — BUPIVACAINE IN DEXTROSE 0.75-8.25 % IT SOLN
INTRATHECAL | Status: DC | PRN
  Administered 2020-06-13: 1.8 mL via INTRATHECAL

## 2020-06-13 MED ORDER — POVIDONE-IODINE 10 % EX SWAB
2.0000 "application " | Freq: Once | CUTANEOUS | Status: AC
  Administered 2020-06-13: 2 via TOPICAL

## 2020-06-13 MED ORDER — PHENOL 1.4 % MT LIQD: 1.0000 | OROMUCOSAL | Status: DC | PRN

## 2020-06-13 MED ORDER — MIDAZOLAM HCL 2 MG/2ML IJ SOLN
INTRAMUSCULAR | Status: AC
  Filled 2020-06-13: qty 2

## 2020-06-13 MED ORDER — TRANEXAMIC ACID-NACL 1000-0.7 MG/100ML-% IV SOLN
1000.0000 mg | Freq: Once | INTRAVENOUS | Status: AC
  Administered 2020-06-13: 1000 mg via INTRAVENOUS
  Filled 2020-06-13: qty 100

## 2020-06-13 MED ORDER — SODIUM CHLORIDE (PF) 0.9 % IJ SOLN
INTRAMUSCULAR | Status: DC | PRN
  Administered 2020-06-13: 20 mL

## 2020-06-13 MED ORDER — HYDROCHLOROTHIAZIDE 12.5 MG PO CAPS
12.5000 mg | ORAL_CAPSULE | Freq: Every day | ORAL | Status: DC
  Administered 2020-06-14: 12.5 mg via ORAL
  Filled 2020-06-13: qty 1

## 2020-06-13 MED ORDER — ACETAMINOPHEN 10 MG/ML IV SOLN
1000.0000 mg | Freq: Once | INTRAVENOUS | Status: DC | PRN
  Administered 2020-06-13: 1000 mg via INTRAVENOUS

## 2020-06-13 MED ORDER — KETOROLAC TROMETHAMINE 15 MG/ML IJ SOLN
15.0000 mg | Freq: Four times a day (QID) | INTRAMUSCULAR | Status: DC
  Administered 2020-06-13 – 2020-06-14 (×2): 15 mg via INTRAVENOUS
  Filled 2020-06-13 (×2): qty 1

## 2020-06-13 MED ORDER — ONDANSETRON HCL 4 MG PO TABS: 4.0000 mg | ORAL_TABLET | Freq: Four times a day (QID) | ORAL | Status: DC | PRN

## 2020-06-13 MED ORDER — OXYCODONE HCL ER 10 MG PO T12A
10.0000 mg | EXTENDED_RELEASE_TABLET | Freq: Two times a day (BID) | ORAL | Status: DC
  Administered 2020-06-13 – 2020-06-14 (×2): 10 mg via ORAL
  Filled 2020-06-13 (×2): qty 1

## 2020-06-13 MED ORDER — FENTANYL CITRATE (PF) 100 MCG/2ML IJ SOLN
INTRAMUSCULAR | Status: AC
  Filled 2020-06-13: qty 2

## 2020-06-13 MED ORDER — POLYETHYLENE GLYCOL 3350 17 G PO PACK
17.0000 g | PACK | Freq: Every day | ORAL | Status: DC
  Administered 2020-06-14: 17 g via ORAL
  Filled 2020-06-13: qty 1

## 2020-06-13 MED ORDER — TRANEXAMIC ACID 1000 MG/10ML IV SOLN
INTRAVENOUS | Status: DC | PRN
  Administered 2020-06-13: 1000 mg via TOPICAL

## 2020-06-13 MED ORDER — MENTHOL 3 MG MT LOZG: 1.0000 | LOZENGE | OROMUCOSAL | Status: DC | PRN

## 2020-06-13 MED ORDER — MIDAZOLAM HCL 5 MG/5ML IJ SOLN
INTRAMUSCULAR | Status: DC | PRN
  Administered 2020-06-13: 2 mg via INTRAVENOUS

## 2020-06-13 SURGICAL SUPPLY — 52 items
BAG SPEC THK2 15X12 ZIP CLS (MISCELLANEOUS) ×1
BAG ZIPLOCK 12X15 (MISCELLANEOUS) ×2 IMPLANT
CELLS DAT CNTRL 66122 CELL SVR (MISCELLANEOUS) ×1 IMPLANT
CLSR STERI-STRIP ANTIMIC 1/2X4 (GAUZE/BANDAGES/DRESSINGS) ×2 IMPLANT
COVER PERINEAL POST (MISCELLANEOUS) ×2 IMPLANT
COVER SURGICAL LIGHT HANDLE (MISCELLANEOUS) ×2 IMPLANT
COVER WAND RF STERILE (DRAPES) IMPLANT
CUP ACET PNNCL SECTR W/GRIP 56 (Hips) IMPLANT
DRAPE IMP U-DRAPE 54X76 (DRAPES) ×4 IMPLANT
DRAPE POUCH INSTRU U-SHP 10X18 (DRAPES) ×2 IMPLANT
DRAPE STERI IOBAN 125X83 (DRAPES) ×4 IMPLANT
DRAPE U-SHAPE 47X51 STRL (DRAPES) ×4 IMPLANT
DRSG AQUACEL AG ADV 3.5X10 (GAUZE/BANDAGES/DRESSINGS) ×2 IMPLANT
DURAPREP 26ML APPLICATOR (WOUND CARE) ×4 IMPLANT
ELECT BLADE TIP CTD 4 INCH (ELECTRODE) ×2 IMPLANT
ELECT REM PT RETURN 15FT ADLT (MISCELLANEOUS) ×2 IMPLANT
GLOVE ECLIPSE 7.0 STRL STRAW (GLOVE) ×4 IMPLANT
GLOVE SURG SYN 7.5  E (GLOVE) ×10
GLOVE SURG SYN 7.5 E (GLOVE) ×5 IMPLANT
GLOVE SURG SYN 7.5 PF PI (GLOVE) ×5 IMPLANT
GLOVE SURG UNDER POLY LF SZ7 (GLOVE) ×2 IMPLANT
GOWN SRG XL LVL 4 BRTHBL STRL (GOWNS) ×2 IMPLANT
GOWN STRL NON-REIN XL LVL4 (GOWNS) ×4
HANDPIECE INTERPULSE COAX TIP (DISPOSABLE) ×2
HEAD CERAMIC 36 PLUS5 (Hips) ×1 IMPLANT
HOOD PEEL AWAY FLYTE STAYCOOL (MISCELLANEOUS) ×6 IMPLANT
JET LAVAGE IRRISEPT WOUND (IRRIGATION / IRRIGATOR) ×2
KIT BASIN OR (CUSTOM PROCEDURE TRAY) ×2 IMPLANT
KIT TURNOVER KIT A (KITS) IMPLANT
LAVAGE JET IRRISEPT WOUND (IRRIGATION / IRRIGATOR) ×1 IMPLANT
MARKER SKIN DUAL TIP RULER LAB (MISCELLANEOUS) ×4 IMPLANT
NDL SPNL 18GX3.5 QUINCKE PK (NEEDLE) ×1 IMPLANT
NEEDLE SPNL 18GX3.5 QUINCKE PK (NEEDLE) ×2 IMPLANT
PACK ANTERIOR HIP CUSTOM (KITS) ×2 IMPLANT
PENCIL SMOKE EVACUATOR (MISCELLANEOUS) IMPLANT
PINN SECTOR W/GRIP ACE CUP 56 (Hips) ×2 IMPLANT
PINNACLE ALTRX PLUS 4 N 36X56 (Hips) ×1 IMPLANT
RETRACTOR WND ALEXIS 18 MED (MISCELLANEOUS) ×1 IMPLANT
RTRCTR WOUND ALEXIS 18CM MED (MISCELLANEOUS) ×2
SAW OSC TIP CART 19.5X105X1.3 (SAW) ×2 IMPLANT
SCREW 6.5MMX30MM (Screw) ×1 IMPLANT
SET HNDPC FAN SPRY TIP SCT (DISPOSABLE) ×1 IMPLANT
STEM FEM ACTIS HIGH SZ8 (Stem) ×1 IMPLANT
STRIP CLOSURE SKIN 1/2X4 (GAUZE/BANDAGES/DRESSINGS) ×1 IMPLANT
SUT ETHIBOND 2 V 37 (SUTURE) ×2 IMPLANT
SUT MNCRL AB 3-0 PS2 18 (SUTURE) IMPLANT
SUT VIC AB 0 CT1 36 (SUTURE) ×2 IMPLANT
SUT VIC AB 1 CTX 36 (SUTURE) ×2
SUT VIC AB 1 CTX36XBRD ANBCTR (SUTURE) ×1 IMPLANT
SUT VIC AB 2-0 CT1 27 (SUTURE) ×4
SUT VIC AB 2-0 CT1 TAPERPNT 27 (SUTURE) ×2 IMPLANT
TRAY FOLEY MTR SLVR 16FR STAT (SET/KITS/TRAYS/PACK) IMPLANT

## 2020-06-13 NOTE — H&P (Signed)
PREOPERATIVE H&P  Chief Complaint: left hip degenerative joint disease  HPI: Gabriel Oconnell is a 55 y.o. male who presents for surgical treatment of left hip degenerative joint disease.  He denies any changes in medical history.  Past Medical History:  Diagnosis Date  . Acoustic neuroma ([place])   . Hearing loss    right ear -no hearing aids  . Hyperlipidemia   . Hypertension    Past Surgical History:  Procedure Laterality Date  . COLONOSCOPY    . KNEE SURGERY     left  . WISDOM TOOTH EXTRACTION     Social History   Socioeconomic History  . Marital status: Married    Spouse name: Not on file  . Number of children: Not on file  . Years of education: Not on file  . Highest education level: Not on file  Occupational History  . Not on file  Tobacco Use  . Smoking status: Former Smoker    Types: Cigarettes    Quit date: 10/13/1995    Years since quitting: 24.6  . Smokeless tobacco: Never Used  . Tobacco comment: social smoker - quit 1997  Vaping Use  . Vaping Use: Never used  Substance and Sexual Activity  . Alcohol use: Yes    Alcohol/week: 2.0 standard drinks    Types: 2 Standard drinks or equivalent per week    Comment: 2-4 drinks per week per pt  . Drug use: No  . Sexual activity: Not on file  Other Topics Concern  . Not on file  Social History Narrative  . Not on file   Social Determinants of Health   Financial Resource Strain: Not on file  Food Insecurity: Not on file  Transportation Needs: Not on file  Physical Activity: Not on file  Stress: Not on file  Social Connections: Not on file   Family History  Problem Relation Age of Onset  . Pancreatic cancer Father   . Colon polyps Father   . Colon cancer Father        late 65's  . Colon cancer Paternal Grandmother        early 68's  . Heart disease Mother    No Known Allergies Prior to Admission medications   Medication Sig Start Date End Date Taking? Authorizing Provider  amLODipine (NORVASC)  10 MG tablet TAKE 1 TABLET DAILY 09/24/19  Yes Copland, Gay Filler, MD  APPLE CIDER VINEGAR PO Take 15 mLs by mouth daily.   Yes [provider]  aspirin EC 81 MG tablet Take 1 tablet (81 mg total) by mouth 2 (two) times daily. 06/10/20   Aundra Dubin, PA-C  cyclobenzaprine (FLEXERIL) 10 MG tablet Take 1 tablet (10 mg total) by mouth 2 (two) times daily as needed for muscle spasms. 01/23/20  Yes Copland, Gay Filler, MD  docusate sodium (COLACE) 100 MG capsule Take 1 capsule (100 mg total) by mouth daily as needed. 06/10/20 06/10/21  Aundra Dubin, PA-C  glucosamine-chondroitin 500-400 MG tablet Take 1 tablet by mouth daily.   Yes [provider]  methocarbamol (ROBAXIN) 500 MG tablet Take 1 tablet (500 mg total) by mouth 2 (two) times daily as needed. 06/10/20   Aundra Dubin, PA-C  Multiple Vitamins-Minerals (MULTIVITAMIN,TX-MINERALS) tablet Take 1 tablet by mouth daily.   Yes [provider]  ondansetron (ZOFRAN) 4 MG tablet Take 1 tablet (4 mg total) by mouth every 8 (eight) hours as needed for nausea or vomiting. 06/10/20   Dwana Melena  L, PA-C  oxyCODONE-acetaminophen (PERCOCET) 5-325 MG tablet Take 1-2 tablets by mouth every 6 (six) hours as needed. 06/10/20   Aundra Dubin, PA-C  simvastatin (ZOCOR) 20 MG tablet TAKE 1 TABLET DAILY Patient taking differently: Take 20 mg by mouth daily. TAKE 1 TABLET DAILY 01/23/20  Yes Copland, Gay Filler, MD  valsartan-hydrochlorothiazide (DIOVAN-HCT) 320-12.5 MG tablet TAKE 1 TABLET DAILY Patient taking differently: Take 1 tablet by mouth daily. TAKE 1 TABLET DAILY 09/24/19  Yes Copland, Gay Filler, MD     Positive ROS: All other systems have been reviewed and were otherwise negative with the exception of those mentioned in the HPI and as above.  Physical Exam: General: Alert, no acute distress Cardiovascular: No pedal edema Respiratory: No cyanosis, no use of accessory musculature GI: abdomen soft Skin: No lesions in the  area of chief complaint Neurologic: Sensation intact distally Psychiatric: Patient is competent for consent with normal mood and affect Lymphatic: no lymphedema  MUSCULOSKELETAL: exam stable  Assessment: left hip degenerative joint disease  Plan: Plan for Procedure(s): LEFT TOTAL HIP ARTHROPLASTY ANTERIOR APPROACH  The risks benefits and alternatives were discussed with the patient including but not limited to the risks of nonoperative treatment, versus surgical intervention including infection, bleeding, nerve injury,  blood clots, cardiopulmonary complications, morbidity, mortality, among others, and they were willing to proceed.   Preoperative templating of the joint replacement has been completed, documented, and submitted to the Operating Room personnel in order to optimize intra-operative equipment management.   Eduard Roux, MD 06/13/2020 10:07 AM

## 2020-06-13 NOTE — Transfer of Care (Signed)
Immediate Anesthesia Transfer of Care Note  Patient: Gabriel Oconnell  Procedure(s) Performed: LEFT TOTAL HIP ARTHROPLASTY ANTERIOR APPROACH (Left Hip)  Patient Location: PACU  Anesthesia Type:Spinal  Level of Consciousness: drowsy  Airway & Oxygen Therapy: Patient Spontanous Breathing and Patient connected to face mask oxygen  Post-op Assessment: Report given to RN and Post -op Vital signs reviewed and stable  Post vital signs: Reviewed and stable  Last Vitals:  Vitals Value Taken Time  BP 108/73 06/13/20 1428  Temp    Pulse 64 06/13/20 1429  Resp 14 06/13/20 1429  SpO2 97 % 06/13/20 1429  Vitals shown include unvalidated device data.  Last Pain:  Vitals:   06/13/20 1055  TempSrc: Oral  PainSc:       Patients Stated Pain Goal: 3 (56/43/32 9518)  Complications: No complications documented.

## 2020-06-13 NOTE — Anesthesia Procedure Notes (Signed)
Spinal  Patient location during procedure: OR Start time: 06/13/2020 12:15 PM End time: 06/13/2020 12:16 PM Staffing Performed: anesthesiologist  Anesthesiologist: Darral Dash, DO Preanesthetic Checklist Completed: patient identified, IV checked, site marked, risks and benefits discussed, surgical consent, monitors and equipment checked, pre-op evaluation and timeout performed Spinal Block Patient position: sitting Prep: DuraPrep Patient monitoring: heart rate, cardiac monitor, continuous pulse ox and blood pressure Approach: midline Location: L4-5 Injection technique: single-shot Needle Needle type: Pencan  Needle gauge: 24 G Needle length: 10 cm Assessment Sensory level: T6 Additional Notes Patient identified. Risks/Benefits/Options discussed with patient including but not limited to bleeding, infection, nerve damage, paralysis, failed block, incomplete pain control, headache, blood pressure changes, nausea, vomiting, reactions to medications, itching and postpartum back pain. Confirmed with bedside nurse the patient's most recent platelet count. Confirmed with patient that they are not currently taking any anticoagulation, have any bleeding history or any family history of bleeding disorders. Patient expressed understanding and wished to proceed. All questions were answered. Sterile technique was used throughout the entire procedure. Please see nursing notes for vital signs. Warning signs of high block given to the patient including shortness of breath, tingling/numbness in hands, complete motor block, or any concerning symptoms with instructions to call for help. Patient was given instructions on fall risk and not to get out of bed. All questions and concerns addressed with instructions to call with any issues or inadequate analgesia.

## 2020-06-13 NOTE — Anesthesia Postprocedure Evaluation (Signed)
Anesthesia Post Note  Patient: Gabriel Oconnell  Procedure(s) Performed: LEFT TOTAL HIP ARTHROPLASTY ANTERIOR APPROACH (Left Hip)     Patient location during evaluation: PACU Anesthesia Type: Spinal Level of consciousness: oriented and awake and alert Pain management: pain level controlled Vital Signs Assessment: post-procedure vital signs reviewed and stable Respiratory status: spontaneous breathing, respiratory function stable and nonlabored ventilation Cardiovascular status: blood pressure returned to baseline and stable Postop Assessment: no headache, no backache, no apparent nausea or vomiting, spinal receding and patient able to bend at knees Anesthetic complications: no   No complications documented.  Last Vitals:  Vitals:   06/13/20 1515 06/13/20 1530  BP: 120/75 106/76  Pulse: 61 (!) 57  Resp: 15 10  Temp:    SpO2: 99% 96%    Last Pain:  Vitals:   06/13/20 1530  TempSrc:   PainSc: Asleep                 Ercia Crisafulli A.

## 2020-06-13 NOTE — Anesthesia Procedure Notes (Signed)
Date/Time: 06/13/2020 12:12 PM Performed by: Sharlette Dense, CRNA Oxygen Delivery Method: Simple face mask

## 2020-06-13 NOTE — Progress Notes (Signed)
PT Cancellation Note  Patient Details Name: Gabriel Oconnell MRN: [id number] DOB: Apr 04, 1966   Cancelled Treatment:    Reason Eval/Treat Not Completed: Other (comment) (RN reports pt limited by pain and lethargic. Will follow up at later date/time as schedule allows.)   Gwynneth Albright PT, DPT Acute Rehabilitation Services Office [phone] Pager [phone]

## 2020-06-13 NOTE — Anesthesia Preprocedure Evaluation (Signed)
Anesthesia Evaluation  Patient identified by MRN, date of birth, ID band Patient awake    Reviewed: Allergy & Precautions, NPO status , Patient's Chart, lab work & pertinent test results  Airway Mallampati: II  TM Distance: >3 FB Neck ROM: Full    Dental  (+) Teeth Intact   Pulmonary neg pulmonary ROS, former smoker,    Pulmonary exam normal        Cardiovascular hypertension, Pt. on medications  Rhythm:Regular Rate:Normal     Neuro/Psych  Neuromuscular disease (acoustic neuroma) negative psych ROS   GI/Hepatic negative GI ROS, Neg liver ROS,   Endo/Other  negative endocrine ROS  Renal/GU negative Renal ROS  negative genitourinary   Musculoskeletal  (+) Arthritis , Osteoarthritis,    Abdominal (+)  Abdomen: soft. Bowel sounds: normal.  Peds  Hematology negative hematology ROS (+)   Anesthesia Other Findings   Reproductive/Obstetrics                             Anesthesia Physical Anesthesia Plan  ASA: II  Anesthesia Plan: MAC and Spinal   Post-op Pain Management:    Induction: Intravenous  PONV Risk Score and Plan: 1 and Ondansetron, Dexamethasone, Propofol infusion and Treatment may vary due to age or medical condition  Airway Management Planned: Simple Face Mask, Natural Airway and Nasal Cannula  Additional Equipment: None  Intra-op Plan:   Post-operative Plan:   Informed Consent: I have reviewed the patients History and Physical, chart, labs and discussed the procedure including the risks, benefits and alternatives for the proposed anesthesia with the patient or authorized representative who has indicated his/her understanding and acceptance.     Dental advisory given  Plan Discussed with: CRNA  Anesthesia Plan Comments: (Lab Results      Component                Value               Date                      WBC                      9.2                 06/09/2020                 HGB                      15.1                06/09/2020                HCT                      43.6                06/09/2020                MCV                      95.6                06/09/2020                PLT  287                 06/09/2020           Lab Results      Component                Value               Date                      NA                       139                 06/09/2020                K                        3.5                 06/09/2020                CO2                      26                  06/09/2020                GLUCOSE                  101 (H)             06/09/2020                BUN                      12                  06/09/2020                CREATININE               0.95                06/09/2020                CALCIUM                  9.7                 06/09/2020                GFRNONAA                 >60                 06/09/2020                GFRAA                    105                 06/13/2008          )        Anesthesia Quick Evaluation

## 2020-06-13 NOTE — Op Note (Signed)
LEFT TOTAL HIP ARTHROPLASTY ANTERIOR APPROACH  Procedure Note Sameer Teeple   [id number]  Pre-op Diagnosis: left hip degenerative joint disease     Post-op Diagnosis: same   Operative Procedures  1. Total hip replacement; Left hip; uncemented [id number]   Surgeon: Frankey Shown, M.D.  Assist: Madalyn Rob, PA-C   Anesthesia: spinal, exparel local  Prosthesis: Depuy Acetabulum: Pinnacle 56 mm Femur: Actis 8 HO Head: 36 mm size: +5 Liner: +4  Bearing Type: ceramic on poly  Total Hip Arthroplasty (Anterior Approach) Op Note:  After informed consent was obtained and the operative extremity marked in the holding area, the patient was brought back to the operating room and placed supine on the HANA table. Next, the operative extremity was prepped and draped in normal sterile fashion. Surgical timeout occurred verifying patient identification, surgical site, surgical procedure and administration of antibiotics.  A modified anterior Smith-Peterson approach to the hip was performed, using the interval between tensor fascia lata and sartorius.  Dissection was carried bluntly down onto the anterior hip capsule. The lateral femoral circumflex vessels were identified and coagulated. A capsulotomy was performed and the capsular flaps tagged for later repair.  The neck osteotomy was performed. The femoral head was removed which was severely degenerative, the acetabular rim was cleared of soft tissue and attention was turned to reaming the acetabulum.  Sequential reaming was performed under fluoroscopic guidance. We reamed to a size 55 mm, and then impacted the acetabular shell. A 30 mm cancellous screw was placed through the shell for added fixation.  The liner was then placed after irrigation and attention turned to the femur.  After placing the femoral hook, the leg was taken to externally rotated, extended and adducted position taking care to perform soft tissue releases to allow for adequate  mobilization of the femur. Soft tissue was cleared from the shoulder of the greater trochanter and the hook elevator used to improve exposure of the proximal femur. Sequential broaching performed up to a size 8. Trial neck and head were placed. The leg was brought back up to neutral and the construct reduced.  Antibiotic irrigation was placed in the surgical wound and kept for at least 1 minute.  The position and sizing of components, offset and leg lengths were checked using fluoroscopy. Stability of the construct was checked in extension and external rotation without any subluxation or impingement of prosthesis. We dislocated the prosthesis, dropped the leg back into position, removed trial components, and irrigated copiously. The final stem and head was then placed, the leg brought back up, the system reduced and fluoroscopy used to verify positioning.  We irrigated, obtained hemostasis and closed the capsule using #2 ethibond suture.  One gram of vancomycin powder was placed in the surgical bed. A dilute solution of 20 cc of normal saline, 1.3% exparel, 0.25% bupivacaine was injected in the soft tissues.  One gram of topical tranexamic acid was injected into the joint.  The fascia was closed with #1 vicryl plus, the deep fat layer was closed with 0 vicryl, the subcutaneous layers closed with 2.0 Vicryl Plus and the skin closed with 3.0 monocryl and steri strips. A sterile dressing was applied. The patient was awakened in the operating room and taken to recovery in stable condition.  All sponge, needle, and instrument counts were correct at the end of the case.   Tawanna Cooler, my PA, was a medical necessity for opening, closing, limb positioning, retracting, exposing, and overall facilitation and timely completion  of the surgery.  Position: supine  Complications: see description of procedure.  Time Out: performed   Drains/Packing: none  Estimated blood loss: see anesthesia record  Returned to  Recovery Room: in good condition.   Antibiotics: yes   Mechanical VTE (DVT) Prophylaxis: sequential compression devices, TED thigh-high  Chemical VTE (DVT) Prophylaxis: aspirin   Fluid Replacement: see anesthesia record  Specimens Removed: 1 to pathology   Sponge and Instrument Count Correct? yes   PACU: portable radiograph - low AP   Plan/RTC: Return in 2 weeks for staple removal. Weight Bearing/Load Lower Extremity: full  Hip precautions: none Suture Removal: 2 weeks   N. Eduard Roux, MD [facility] 1:47 PM   Implant Name Type Inv. Item Serial No. Manufacturer Lot No. LRB No. Used Action  PINN SECTOR W/GRIP ACE CUP 56 - [id number] Hips PINN SECTOR W/GRIP ACE CUP 56  [employer] [id number] Left 1 Implanted  SCREW 6.5MMX30MM - [id number] Screw SCREW 6.5MMX30MM  [employer] [id number] Left 1 Implanted  PINNACLE ALTRX PLUS 4 N 36X56 - [id number] Hips PINNACLE ALTRX PLUS 4 N 36X56  [employer] [id number] Left 1 Implanted  HEAD CERAMIC 36 PLUS5 - [id number] Hips HEAD CERAMIC 36 PLUS5  [employer] [id number] Left 1 Implanted  STEM FEM ACTIS HIGH SZ8 - [id number] Stem STEM FEM ACTIS HIGH SZ8  [employer] [id number] Left 1 Implanted

## 2020-06-13 NOTE — Plan of Care (Signed)
Patient admitted to unit all careplans initiated  

## 2020-06-14 ENCOUNTER — Other Ambulatory Visit: Payer: Self-pay | Admitting: Physician Assistant

## 2020-06-14 DIAGNOSIS — M1612 Unilateral primary osteoarthritis, left hip: Secondary | ICD-10-CM | POA: Diagnosis not present

## 2020-06-14 DIAGNOSIS — Z79899 Other long term (current) drug therapy: Secondary | ICD-10-CM | POA: Diagnosis not present

## 2020-06-14 DIAGNOSIS — M25552 Pain in left hip: Secondary | ICD-10-CM | POA: Diagnosis not present

## 2020-06-14 DIAGNOSIS — Z7982 Long term (current) use of aspirin: Secondary | ICD-10-CM | POA: Diagnosis not present

## 2020-06-14 DIAGNOSIS — Z87891 Personal history of nicotine dependence: Secondary | ICD-10-CM | POA: Diagnosis not present

## 2020-06-14 DIAGNOSIS — I10 Essential (primary) hypertension: Secondary | ICD-10-CM | POA: Diagnosis not present

## 2020-06-14 DIAGNOSIS — R531 Weakness: Secondary | ICD-10-CM | POA: Diagnosis not present

## 2020-06-14 LAB — CBC
HCT: 37.2 % — ABNORMAL LOW (ref 39.0–52.0)
Hemoglobin: 12.9 g/dL — ABNORMAL LOW (ref 13.0–17.0)
MCH: 32.8 pg (ref 26.0–34.0)
MCHC: 34.7 g/dL (ref 30.0–36.0)
MCV: 94.7 fL (ref 80.0–100.0)
Platelets: 236 10*3/uL (ref 150–400)
RBC: 3.93 MIL/uL — ABNORMAL LOW (ref 4.22–5.81)
RDW: 12.1 % (ref 11.5–15.5)
WBC: 13 10*3/uL — ABNORMAL HIGH (ref 4.0–10.5)
nRBC: 0 % (ref 0.0–0.2)

## 2020-06-14 LAB — BASIC METABOLIC PANEL
Anion gap: 11 (ref 5–15)
BUN: 13 mg/dL (ref 6–20)
CO2: 25 mmol/L (ref 22–32)
Calcium: 9 mg/dL (ref 8.9–10.3)
Chloride: 104 mmol/L (ref 98–111)
Creatinine, Ser: 0.82 mg/dL (ref 0.61–1.24)
GFR, Estimated: >60 mL/min (ref >60)
Glucose, Bld: 186 mg/dL — ABNORMAL HIGH (ref 70–99)
Potassium: 4.1 mmol/L (ref 3.5–5.1)
Sodium: 140 mmol/L (ref 135–145)

## 2020-06-14 NOTE — Plan of Care (Signed)
  Problem: Education: Goal: Knowledge of the prescribed therapeutic regimen will improve Outcome: Progressing   Problem: Activity: Goal: Ability to avoid complications of mobility impairment will improve Outcome: Progressing   Problem: Pain Management: Goal: Pain level will decrease with appropriate interventions Outcome: Progressing   

## 2020-06-14 NOTE — Plan of Care (Signed)
Patient dc'd all care plans met  

## 2020-06-14 NOTE — Discharge Summary (Signed)
Patient ID: Gabriel Oconnell MRN: [id number] DOB/AGE: Aug 14, 1965 55 y.o.  Admit date: 06/13/2020 Discharge date: 06/14/2020  Admission Diagnoses:  Principal Problem:   Primary osteoarthritis of left hip Active Problems:   Status post total replacement of left hip   Discharge Diagnoses:  Same  Past Medical History:  Diagnosis Date  . Acoustic neuroma ([place])   . Hearing loss    right ear -no hearing aids  . Hyperlipidemia   . Hypertension     Surgeries: Procedure(s): LEFT TOTAL HIP ARTHROPLASTY ANTERIOR APPROACH on 06/13/2020   Consultants:   Discharged Condition: Improved  Hospital Course: Gabriel Oconnell is an 55 y.o. male who was admitted 06/13/2020 for operative treatment ofPrimary osteoarthritis of left hip. Patient has severe unremitting pain that affects sleep, daily activities, and work/hobbies. After pre-op clearance the patient was taken to the operating room on 06/13/2020 and underwent  Procedure(s): LEFT TOTAL HIP ARTHROPLASTY ANTERIOR APPROACH.    Patient was given perioperative antibiotics:  Anti-infectives (From admission, onward)   Start     Dose/Rate Route Frequency Ordered Stop   06/13/20 1800  ceFAZolin (ANCEF) IVPB 2g/100 mL premix        2 g 200 mL/hr over 30 Minutes Intravenous Every 6 hours 06/13/20 1602 06/14/20 0215   06/13/20 1340  vancomycin (VANCOCIN) powder  Status:  Discontinued          As needed 06/13/20 1340 06/13/20 1559   06/13/20 1030  ceFAZolin (ANCEF) IVPB 2g/100 mL premix        2 g 200 mL/hr over 30 Minutes Intravenous On call to O.R. 06/13/20 1018 06/13/20 1224       Patient was given sequential compression devices, early ambulation, and chemoprophylaxis to prevent DVT.  Patient benefited maximally from hospital stay and there were no complications.    Recent vital signs:  Patient Vitals for the past 24 hrs:  BP Temp Temp src Pulse Resp SpO2 Height Weight  06/14/20 0451 (!) 120/92 97.6 F (36.4 C) Oral 82 16 97 % -- --  06/14/20  0144 (!) 142/96 97.6 F (36.4 C) Oral 74 16 97 % -- --  06/13/20 2208 140/90 98.1 F (36.7 C) Oral 66 18 98 % -- --  06/13/20 1759 -- 98 F (36.7 C) Axillary 60 16 95 % -- --  06/13/20 1658 116/65 97.6 F (36.4 C) Axillary (!) 58 12 94 % -- --  06/13/20 1600 126/81 98.1 F (36.7 C) Axillary (!) 55 12 95 % -- --  06/13/20 1545 110/76 97.9 F (36.6 C) -- (!) 53 10 96 % -- --  06/13/20 1530 106/76 -- -- (!) 57 10 96 % -- --  06/13/20 1515 120/75 -- -- 61 15 99 % -- --  06/13/20 1500 (!) 138/96 -- -- 65 17 98 % -- --  06/13/20 1445 102/83 -- -- 67 10 100 % -- --  06/13/20 1430 105/74 98.1 F (36.7 C) -- 64 14 97 % -- --  06/13/20 1428 108/73 -- -- 62 11 98 % -- --  06/13/20 1055 (!) 163/98 98.6 F (37 C) Oral 75 15 99 % -- --  06/13/20 1033 -- -- -- -- -- -- 6' (1.829 m) 112.2 kg     Recent laboratory studies:  Recent Labs    06/14/20 0336  WBC 13.0*  HGB 12.9*  HCT 37.2*  PLT 236  NA 140  K 4.1  CL 104  CO2 25  BUN 13  CREATININE 0.82  GLUCOSE 186*  CALCIUM  9.0     Discharge Medications:   Allergies as of 06/14/2020   No Known Allergies     Medication List    STOP taking these medications   glucosamine-chondroitin 500-400 MG tablet     TAKE these medications   amLODipine 10 MG tablet Commonly known as: NORVASC TAKE 1 TABLET DAILY   APPLE CIDER VINEGAR PO Take 15 mLs by mouth daily.   aspirin EC 81 MG tablet Take 1 tablet (81 mg total) by mouth 2 (two) times daily.   cyclobenzaprine 10 MG tablet Commonly known as: FLEXERIL Take 1 tablet (10 mg total) by mouth 2 (two) times daily as needed for muscle spasms.   docusate sodium 100 MG capsule Commonly known as: Colace Take 1 capsule (100 mg total) by mouth daily as needed.   methocarbamol 500 MG tablet Commonly known as: Robaxin Take 1 tablet (500 mg total) by mouth 2 (two) times daily as needed.   multivitamin,tx-minerals tablet Take 1 tablet by mouth daily.   ondansetron 4 MG tablet Commonly  known as: Zofran Take 1 tablet (4 mg total) by mouth every 8 (eight) hours as needed for nausea or vomiting.   oxyCODONE-acetaminophen 5-325 MG tablet Commonly known as: Percocet Take 1-2 tablets by mouth every 6 (six) hours as needed.   simvastatin 20 MG tablet Commonly known as: ZOCOR TAKE 1 TABLET DAILY What changed:   how much to take  how to take this  when to take this   valsartan-hydrochlorothiazide 320-12.5 MG tablet Commonly known as: DIOVAN-HCT TAKE 1 TABLET DAILY What changed: additional instructions            Durable Medical Equipment  (From admission, onward)         Start     Ordered   06/13/20 1603  DME Walker rolling  Once       Question:  Patient needs a walker to treat with the following condition  Answer:  History of hip replacement   06/13/20 1602   06/13/20 1603  DME 3 n 1  Once        06/13/20 1602   06/13/20 1603  DME Bedside commode  Once       Question:  Patient needs a bedside commode to treat with the following condition  Answer:  History of hip replacement   06/13/20 1602          Diagnostic Studies: DG Chest 2 View  Result Date: 06/09/2020 CLINICAL DATA:  Primary localized osteoarthritis of the LEFT hip EXAM: CHEST - 2 VIEW COMPARISON:  None FINDINGS: Normal heart size, mediastinal contours, and pulmonary vascularity. Lungs clear. No pleural effusion or pneumothorax. Bones unremarkable. IMPRESSION: Normal exam. Electronically Signed   By: Lavonia Dana M.D.   On: 06/09/2020 16:55   DG Pelvis Portable  Result Date: 06/13/2020 CLINICAL DATA:  Status post left hip replacement today. EXAM: PORTABLE PELVIS 1-2 VIEWS COMPARISON:  Intraoperative imaging earlier today. FINDINGS: New left hip arthroplasty is in place. No fracture or dislocation. Gas in the soft tissues from surgery noted. IMPRESSION: Status post left hip replacement.  No acute finding. Electronically Signed   By: Inge Rise M.D.   On: 06/13/2020 15:00   DG C-Arm 1-60  Min-No Report  Result Date: 06/13/2020 Fluoroscopy was utilized by the requesting physician.  No radiographic interpretation.   DG HIP OPERATIVE UNILAT WITH PELVIS LEFT  Result Date: 06/13/2020 CLINICAL DATA:  Left hip arthroplasty EXAM: OPERATIVE LEFT HIP (WITH PELVIS IF PERFORMED) AP VIEWS  TECHNIQUE: Fluoroscopic spot image(s) were submitted for interpretation post-operatively. COMPARISON:  02/05/2020 FINDINGS: 6 C-arm fluoroscopic images were obtained intraoperatively and submitted for post operative interpretation. Placement of left total hip arthroplasty hardware. Hardware appears in its expected alignment without evidence of intraoperative complication. Advanced arthropathy of the visualized right hip. 22 seconds of fluoroscopy time was utilized. Please see the performing provider's procedural report for further detail. IMPRESSION: As above. Electronically Signed   By: Davina Poke D.O.   On: 06/13/2020 14:01    Disposition: Discharge disposition: 01-Home or Self Care          Follow-up Information    Leandrew Koyanagi, MD In 2 weeks.   Specialty: Orthopedic Surgery Why: For suture removal, For wound re-check Contact information: [place] [id number] [phone]                Signed: Aundra Dubin 06/14/2020, 9:33 AM

## 2020-06-14 NOTE — Discharge Instructions (Signed)

## 2020-06-14 NOTE — Progress Notes (Signed)
Subjective: 1 Day Post-Op Procedure(s) (LRB): LEFT TOTAL HIP ARTHROPLASTY ANTERIOR APPROACH (Left) Patient reports pain as mild.    Objective: Vital signs in last 24 hours: Temp:  [97.6 F (36.4 C)-98.6 F (37 C)] 97.6 F (36.4 C) (01/15 0451) Pulse Rate:  [53-82] 82 (01/15 0451) Resp:  [10-18] 16 (01/15 0451) BP: (102-163)/(65-98) 120/92 (01/15 0451) SpO2:  [94 %-100 %] 97 % (01/15 0451) Weight:  [112.2 kg] 112.2 kg (01/14 1033)  Intake/Output from previous day: 01/14 0701 - 01/15 0700 In: 3711.9 [P.O.:60; I.V.:2999; IV Piggyback:652.9] Out: 1950 [Urine:1750; Blood:200] Intake/Output this shift: Total I/O In: 240 [P.O.:240] Out: 200 [Urine:200]  Recent Labs    06/14/20 0336  HGB 12.9*   Recent Labs    06/14/20 0336  WBC 13.0*  RBC 3.93*  HCT 37.2*  PLT 236   Recent Labs    06/14/20 0336  NA 140  K 4.1  CL 104  CO2 25  BUN 13  CREATININE 0.82  GLUCOSE 186*  CALCIUM 9.0   No results for input(s): LABPT, INR in the last 72 hours.  Neurologically intact Neurovascular intact Sensation intact distally Intact pulses distally Dorsiflexion/Plantar flexion intact Incision: dressing C/D/I No cellulitis present Compartment soft   Assessment/Plan: 1 Day Post-Op Procedure(s) (LRB): LEFT TOTAL HIP ARTHROPLASTY ANTERIOR APPROACH (Left) Advance diet Up with therapy D/C IV fluids Discharge home with home health after first or second PT session (depends on how comfortable he feels).  He is very eager to go home before noon if possible WBAT LLE ABLA- mild and stable       Gabriel Oconnell 06/14/2020, 9:31 AM

## 2020-06-14 NOTE — Evaluation (Signed)
Physical Therapy Evaluation Patient Details Name: Lyle Niblett MRN: [id number] DOB: April 21, 1966 Today's Date: 06/14/2020   History of Present Illness  55 yo male s/p L THA-direct anterior 06/14/19  Clinical Impression  On eval, pt was Supv-Mod Ind with mobility. He walked ~250 feet with a RW. Pain rated 5/10 with activity. Reviewed/practiced exercises, gait training, and stair training. Issued HEP at pt's request. All education completed. Okay to d/c from PT standpoint. Plan is for home with HHPT.     Follow Up Recommendations Follow surgeon's recommendation for DC plan and follow-up therapies    Equipment Recommendations  Rolling walker with 5" wheels    Recommendations for Other Services       Precautions / Restrictions Precautions Precautions: Fall Restrictions Weight Bearing Restrictions: No LLE Weight Bearing: Weight bearing as tolerated      Mobility  Bed Mobility               General bed mobility comments: oob in recliner    Transfers Overall transfer level: Needs assistance Equipment used: Rolling walker (2 wheeled) Transfers: Sit to/from Stand Sit to Stand: Modified independent (Device/Increase time)            Ambulation/Gait Ambulation/Gait assistance: Supervision Gait Distance (Feet): 250 Feet Assistive device: Rolling walker (2 wheeled) Gait Pattern/deviations: Step-through pattern;Decreased stride length     General Gait Details: Gait is mildly antalgic. Also pt reports feeling like leg lengths are a bit unequal. Pt denied lightheadedness. Tolerated distance well.  Stairs Stairs: Yes Stairs assistance: Supervision Stair Management: One rail Right;Step to pattern;Forwards Number of Stairs: 3 General stair comments: VCs safety, technique, sequence.  Wheelchair Mobility    Modified Rankin (Stroke Patients Only)       Balance Overall balance assessment: Mild deficits observed, not formally tested                                            Pertinent Vitals/Pain Pain Assessment: 0-10 Pain Score: 5  Pain Location: L hip/thigh Pain Descriptors / Indicators: Discomfort;Sore Pain Intervention(s): Monitored during session;Repositioned    Home Living Family/patient expects to be discharged to:: Private residence Living Arrangements: Spouse/significant other   Type of Home: House Home Access: Stairs to enter Entrance Stairs-Rails: Right Entrance Stairs-Number of Steps: 3 Home Layout: Two level;Able to live on main level with bedroom/bathroom Home Equipment: None      Prior Function Level of Independence: Independent               Hand Dominance        Extremity/Trunk Assessment   Upper Extremity Assessment Upper Extremity Assessment: Overall WFL for tasks assessed    Lower Extremity Assessment Lower Extremity Assessment: Generalized weakness    Cervical / Trunk Assessment Cervical / Trunk Assessment: Normal  Communication   Communication: No difficulties  Cognition Arousal/Alertness: Awake/alert Behavior During Therapy: WFL for tasks assessed/performed Overall Cognitive Status: Within Functional Limits for tasks assessed                                        General Comments      Exercises Total Joint Exercises Hip ABduction/ADduction: AROM;Left;10 reps;Standing Long Arc Quad: AROM;Left;10 reps;Seated Knee Flexion: AROM;Left;10 reps;Standing Marching in Standing: AROM;Both;10 reps;Standing General Exercises - Lower Extremity Heel Raises: AROM;Both;10 reps;Standing  Assessment/Plan    PT Assessment All further PT needs can be met in the next venue of care (HHPT)  PT Problem List Decreased strength;Decreased mobility;Decreased range of motion;Decreased activity tolerance;Decreased balance;Pain       PT Treatment Interventions DME instruction;Gait training;Therapeutic activities;Therapeutic exercise;Patient/family education;Balance  training;Functional mobility training    PT Goals (Current goals can be found in the Care Plan section)  Acute Rehab PT Goals Patient Stated Goal: regain PLOF/independence. PT Goal Formulation: With patient Time For Goal Achievement: 06/28/20 Potential to Achieve Goals: Good    Frequency 7X/week   Barriers to discharge        Co-evaluation               AM-PAC PT "6 Clicks" Mobility  Outcome Measure Help needed turning from your back to your side while in a flat bed without using bedrails?: A Little Help needed moving from lying on your back to sitting on the side of a flat bed without using bedrails?: A Little Help needed moving to and from a bed to a chair (including a wheelchair)?: A Little Help needed standing up from a chair using your arms (e.g., wheelchair or bedside chair)?: A Little Help needed to walk in hospital room?: A Little Help needed climbing 3-5 steps with a railing? : A Little 6 Click Score: 18    End of Session Equipment Utilized During Treatment: Gait belt Activity Tolerance: Patient tolerated treatment well Patient left: in chair;with call bell/phone within reach        Time: 9906-8934 PT Time Calculation (min) (ACUTE ONLY): 22 min   Charges:   PT Evaluation $PT Eval Low Complexity: [place], PT Acute Rehabilitation  Office: [phone] Pager: [phone]

## 2020-06-14 NOTE — Progress Notes (Signed)
Received referral to assist with [place] PT and DME (RW and 3-in-1 BSC). Referral made to [facility] by the surgeon's office prior to surgery. Contacted Karmen Bongo with [facility] for DME referral. Darrel Reach with [facility] and confirmed referral.

## 2020-06-16 ENCOUNTER — Encounter (HOSPITAL_COMMUNITY): Payer: Self-pay | Admitting: Orthopaedic Surgery

## 2020-06-16 NOTE — Telephone Encounter (Signed)
I am not sure how much longer the operative leg is.  Do you have an idea?

## 2020-06-17 DIAGNOSIS — Z96642 Presence of left artificial hip joint: Secondary | ICD-10-CM | POA: Diagnosis not present

## 2020-06-17 DIAGNOSIS — H9191 Unspecified hearing loss, right ear: Secondary | ICD-10-CM | POA: Diagnosis not present

## 2020-06-17 DIAGNOSIS — H933X1 Disorders of right acoustic nerve: Secondary | ICD-10-CM | POA: Diagnosis not present

## 2020-06-17 DIAGNOSIS — E785 Hyperlipidemia, unspecified: Secondary | ICD-10-CM | POA: Diagnosis not present

## 2020-06-17 DIAGNOSIS — Z471 Aftercare following joint replacement surgery: Secondary | ICD-10-CM | POA: Diagnosis not present

## 2020-06-17 DIAGNOSIS — I10 Essential (primary) hypertension: Secondary | ICD-10-CM | POA: Diagnosis not present

## 2020-06-17 DIAGNOSIS — Z87891 Personal history of nicotine dependence: Secondary | ICD-10-CM | POA: Diagnosis not present

## 2020-06-17 DIAGNOSIS — H9311 Tinnitus, right ear: Secondary | ICD-10-CM | POA: Diagnosis not present

## 2020-06-17 DIAGNOSIS — Z7982 Long term (current) use of aspirin: Secondary | ICD-10-CM | POA: Diagnosis not present

## 2020-06-18 ENCOUNTER — Encounter (HOSPITAL_COMMUNITY): Payer: Self-pay | Admitting: Orthopaedic Surgery

## 2020-06-18 DIAGNOSIS — H933X1 Disorders of right acoustic nerve: Secondary | ICD-10-CM | POA: Diagnosis not present

## 2020-06-18 DIAGNOSIS — Z7982 Long term (current) use of aspirin: Secondary | ICD-10-CM | POA: Diagnosis not present

## 2020-06-18 DIAGNOSIS — Z96642 Presence of left artificial hip joint: Secondary | ICD-10-CM | POA: Diagnosis not present

## 2020-06-18 DIAGNOSIS — H9311 Tinnitus, right ear: Secondary | ICD-10-CM | POA: Diagnosis not present

## 2020-06-18 DIAGNOSIS — I10 Essential (primary) hypertension: Secondary | ICD-10-CM | POA: Diagnosis not present

## 2020-06-18 DIAGNOSIS — Z87891 Personal history of nicotine dependence: Secondary | ICD-10-CM | POA: Diagnosis not present

## 2020-06-18 DIAGNOSIS — E785 Hyperlipidemia, unspecified: Secondary | ICD-10-CM | POA: Diagnosis not present

## 2020-06-18 DIAGNOSIS — Z471 Aftercare following joint replacement surgery: Secondary | ICD-10-CM | POA: Diagnosis not present

## 2020-06-18 DIAGNOSIS — H9191 Unspecified hearing loss, right ear: Secondary | ICD-10-CM | POA: Diagnosis not present

## 2020-06-18 NOTE — Addendum Note (Signed)
Addendum  created 06/18/20 1119 by Saabir Blyth, March Rummage, DO   Attestation recorded in [place], [employer] filed

## 2020-06-21 DIAGNOSIS — E785 Hyperlipidemia, unspecified: Secondary | ICD-10-CM | POA: Diagnosis not present

## 2020-06-21 DIAGNOSIS — Z96642 Presence of left artificial hip joint: Secondary | ICD-10-CM | POA: Diagnosis not present

## 2020-06-21 DIAGNOSIS — I10 Essential (primary) hypertension: Secondary | ICD-10-CM | POA: Diagnosis not present

## 2020-06-21 DIAGNOSIS — H933X1 Disorders of right acoustic nerve: Secondary | ICD-10-CM | POA: Diagnosis not present

## 2020-06-21 DIAGNOSIS — Z471 Aftercare following joint replacement surgery: Secondary | ICD-10-CM | POA: Diagnosis not present

## 2020-06-21 DIAGNOSIS — H9191 Unspecified hearing loss, right ear: Secondary | ICD-10-CM | POA: Diagnosis not present

## 2020-06-21 DIAGNOSIS — H9311 Tinnitus, right ear: Secondary | ICD-10-CM | POA: Diagnosis not present

## 2020-06-21 DIAGNOSIS — Z7982 Long term (current) use of aspirin: Secondary | ICD-10-CM | POA: Diagnosis not present

## 2020-06-21 DIAGNOSIS — Z87891 Personal history of nicotine dependence: Secondary | ICD-10-CM | POA: Diagnosis not present

## 2020-06-23 DIAGNOSIS — H933X1 Disorders of right acoustic nerve: Secondary | ICD-10-CM | POA: Diagnosis not present

## 2020-06-23 DIAGNOSIS — I10 Essential (primary) hypertension: Secondary | ICD-10-CM | POA: Diagnosis not present

## 2020-06-23 DIAGNOSIS — Z96642 Presence of left artificial hip joint: Secondary | ICD-10-CM | POA: Diagnosis not present

## 2020-06-23 DIAGNOSIS — H9191 Unspecified hearing loss, right ear: Secondary | ICD-10-CM | POA: Diagnosis not present

## 2020-06-23 DIAGNOSIS — Z471 Aftercare following joint replacement surgery: Secondary | ICD-10-CM | POA: Diagnosis not present

## 2020-06-23 DIAGNOSIS — E785 Hyperlipidemia, unspecified: Secondary | ICD-10-CM | POA: Diagnosis not present

## 2020-06-23 DIAGNOSIS — H9311 Tinnitus, right ear: Secondary | ICD-10-CM | POA: Diagnosis not present

## 2020-06-23 DIAGNOSIS — Z87891 Personal history of nicotine dependence: Secondary | ICD-10-CM | POA: Diagnosis not present

## 2020-06-23 DIAGNOSIS — Z7982 Long term (current) use of aspirin: Secondary | ICD-10-CM | POA: Diagnosis not present

## 2020-06-24 ENCOUNTER — Telehealth: Payer: Self-pay | Admitting: Orthopaedic Surgery

## 2020-06-24 DIAGNOSIS — H933X1 Disorders of right acoustic nerve: Secondary | ICD-10-CM | POA: Diagnosis not present

## 2020-06-24 DIAGNOSIS — Z96642 Presence of left artificial hip joint: Secondary | ICD-10-CM | POA: Diagnosis not present

## 2020-06-24 DIAGNOSIS — Z7982 Long term (current) use of aspirin: Secondary | ICD-10-CM | POA: Diagnosis not present

## 2020-06-24 DIAGNOSIS — Z87891 Personal history of nicotine dependence: Secondary | ICD-10-CM | POA: Diagnosis not present

## 2020-06-24 DIAGNOSIS — Z471 Aftercare following joint replacement surgery: Secondary | ICD-10-CM | POA: Diagnosis not present

## 2020-06-24 DIAGNOSIS — E785 Hyperlipidemia, unspecified: Secondary | ICD-10-CM | POA: Diagnosis not present

## 2020-06-24 DIAGNOSIS — H9191 Unspecified hearing loss, right ear: Secondary | ICD-10-CM | POA: Diagnosis not present

## 2020-06-24 DIAGNOSIS — I10 Essential (primary) hypertension: Secondary | ICD-10-CM | POA: Diagnosis not present

## 2020-06-24 DIAGNOSIS — H9311 Tinnitus, right ear: Secondary | ICD-10-CM | POA: Diagnosis not present

## 2020-06-24 NOTE — Telephone Encounter (Signed)
[employer] forms received. Sent to [employer]. 

## 2020-06-26 DIAGNOSIS — Z87891 Personal history of nicotine dependence: Secondary | ICD-10-CM | POA: Diagnosis not present

## 2020-06-26 DIAGNOSIS — H933X1 Disorders of right acoustic nerve: Secondary | ICD-10-CM | POA: Diagnosis not present

## 2020-06-26 DIAGNOSIS — Z96642 Presence of left artificial hip joint: Secondary | ICD-10-CM | POA: Diagnosis not present

## 2020-06-26 DIAGNOSIS — H9311 Tinnitus, right ear: Secondary | ICD-10-CM | POA: Diagnosis not present

## 2020-06-26 DIAGNOSIS — E785 Hyperlipidemia, unspecified: Secondary | ICD-10-CM | POA: Diagnosis not present

## 2020-06-26 DIAGNOSIS — Z7982 Long term (current) use of aspirin: Secondary | ICD-10-CM | POA: Diagnosis not present

## 2020-06-26 DIAGNOSIS — H9191 Unspecified hearing loss, right ear: Secondary | ICD-10-CM | POA: Diagnosis not present

## 2020-06-26 DIAGNOSIS — Z471 Aftercare following joint replacement surgery: Secondary | ICD-10-CM | POA: Diagnosis not present

## 2020-06-26 DIAGNOSIS — I10 Essential (primary) hypertension: Secondary | ICD-10-CM | POA: Diagnosis not present

## 2020-06-27 ENCOUNTER — Other Ambulatory Visit: Payer: Self-pay

## 2020-06-27 ENCOUNTER — Encounter: Payer: Self-pay | Admitting: Orthopaedic Surgery

## 2020-06-27 ENCOUNTER — Ambulatory Visit (INDEPENDENT_AMBULATORY_CARE_PROVIDER_SITE_OTHER): Payer: BC Managed Care – PPO | Admitting: Orthopaedic Surgery

## 2020-06-27 DIAGNOSIS — Z96642 Presence of left artificial hip joint: Secondary | ICD-10-CM

## 2020-06-27 NOTE — Progress Notes (Signed)
Post-Op Visit Note   Patient: Gabriel Oconnell           Date of Birth: 1965-09-08           MRN: [id number] Visit Date: 06/27/2020 PCP: Darreld Mclean, MD   Assessment & Plan:  Chief Complaint:  Chief Complaint  Patient presents with  . Left Hip - Routine Post Op, Pain   Visit Diagnoses:  1. Status post total replacement of left hip     Plan:   [name] is 2 weeks status post left total hip replacement.  He is doing well and has finished home health PT.  He has some mild pain.  Not using any assistive devices.  He does notice a slight leg length discrepancy.  He has not taken any opiate pain medication in a few days.  Left hip shows healed surgical incision.  No signs of infection.  Mild swelling.  Painless range of motion of the hip.  Today we applied Steri-Strips.  I have instructed him to put Neosporin to the top corner of the incision with a Band-Aid twice a day.  We will give him a prescription for heel lift for this temporary leg length discrepancy until he has a right hip replacement.  He can continue to increase activity as tolerated.  Continue aspirin for DVT prophylaxis.  Questions encouraged and answered.  Follow-up in 4 weeks with standing AP pelvis x-rays.  Follow-Up Instructions: Return in about 4 weeks (around 07/25/2020).   Orders:  No orders of the defined types were placed in this encounter.  No orders of the defined types were placed in this encounter.   Imaging: No results found.  PMFS History: Patient Active Problem List   Diagnosis Date Noted  . Status post total replacement of left hip 06/13/2020  . Chronic left-sided low back pain with left-sided sciatica 02/05/2020  . Primary osteoarthritis of left hip 02/05/2020  . Prediabetes 01/24/2020  . Obesity 07/03/2015  . Right foot pain 04/30/2015  . Left foot pain 06/27/2014  . Vestibular schwannoma ([place]) 12/06/2012  . Ringing in right ear 10/12/2012  . Hip pain 07/07/2012  . Family hx of colon  cancer 09/29/2010  . DANDRUFF 08/07/2008  . LIPOMA 03/25/2008  . Hyperlipidemia 08/24/2006  . Essential hypertension 08/24/2006   Past Medical History:  Diagnosis Date  . Acoustic neuroma ([place])   . Hearing loss    right ear -no hearing aids  . Hyperlipidemia   . Hypertension     Family History  Problem Relation Age of Onset  . Pancreatic cancer Father   . Colon polyps Father   . Colon cancer Father        late 48's  . Colon cancer Paternal Grandmother        early 41's  . Heart disease Mother     Past Surgical History:  Procedure Laterality Date  . COLONOSCOPY    . KNEE SURGERY     left  . TOTAL HIP ARTHROPLASTY Left 06/13/2020   Procedure: LEFT TOTAL HIP ARTHROPLASTY ANTERIOR APPROACH;  Surgeon: Leandrew Koyanagi, MD;  Location: [facility];  Service: Orthopedics;  Laterality: Left;  3C  . WISDOM TOOTH EXTRACTION     Social History   Occupational History  . Not on file  Tobacco Use  . Smoking status: Former Smoker    Types: Cigarettes    Quit date: 10/13/1995    Years since quitting: 24.7  . Smokeless tobacco: Never Used  . Tobacco comment: social  smoker - quit 1997  Vaping Use  . Vaping Use: Never used  Substance and Sexual Activity  . Alcohol use: Yes    Alcohol/week: 2.0 standard drinks    Types: 2 Standard drinks or equivalent per week    Comment: 2-4 drinks per week per pt  . Drug use: No  . Sexual activity: Not on file

## 2020-07-23 ENCOUNTER — Ambulatory Visit (INDEPENDENT_AMBULATORY_CARE_PROVIDER_SITE_OTHER): Payer: BC Managed Care – PPO

## 2020-07-23 ENCOUNTER — Ambulatory Visit (INDEPENDENT_AMBULATORY_CARE_PROVIDER_SITE_OTHER): Payer: BC Managed Care – PPO | Admitting: Physician Assistant

## 2020-07-23 ENCOUNTER — Other Ambulatory Visit: Payer: Self-pay

## 2020-07-23 ENCOUNTER — Telehealth: Payer: Self-pay | Admitting: Physician Assistant

## 2020-07-23 ENCOUNTER — Encounter: Payer: Self-pay | Admitting: Orthopaedic Surgery

## 2020-07-23 DIAGNOSIS — Z96642 Presence of left artificial hip joint: Secondary | ICD-10-CM

## 2020-07-23 NOTE — Telephone Encounter (Signed)
Ok to do note

## 2020-07-23 NOTE — Telephone Encounter (Signed)
Patient called asked if a return to work note can be e-mailed to him? The e-mailed address is [email or web address]  Patient said he will be returning to work 07/25/2020. The number to contact patient is [phone]

## 2020-07-23 NOTE — Progress Notes (Signed)
Post-Op Visit Note   Patient: Gabriel Oconnell           Date of Birth: 19-Jan-1966           MRN: [id number] Visit Date: 07/23/2020 PCP: Darreld Mclean, MD   Assessment & Plan:  Chief Complaint:  Chief Complaint  Patient presents with  . Left Hip - Pain   Visit Diagnoses:  1. Status post total replacement of left hip     Plan: Patient is a very pleasant 55 year old gentleman who comes in today nearly 6 weeks out left anterior total hip replacement.  He has been doing well.  And occasional soreness but nothing more.  He is not taking anything for pain.  Examination of his left hip reveals a fully healed surgical scar without complication.  He has very good hip flexion.  He is neurovascular intact distally.  At this point, he will continue to advance with activity as tolerated.  Dental prophylaxis reinforced.  Follow-up with [place] in 6 weeks time for recheck.  Call with concerns or questions.  Follow-Up Instructions: Return in about 6 weeks (around 09/03/2020).   Orders:  Orders Placed This Encounter  Procedures  . XR Pelvis 1-2 Views   No orders of the defined types were placed in this encounter.   Imaging: XR Pelvis 1-2 Views  Result Date: 07/23/2020 Well-seated prosthesis without complication   PMFS History: Patient Active Problem List   Diagnosis Date Noted  . Status post total replacement of left hip 06/13/2020  . Chronic left-sided low back pain with left-sided sciatica 02/05/2020  . Prediabetes 01/24/2020  . Obesity 07/03/2015  . Right foot pain 04/30/2015  . Left foot pain 06/27/2014  . Vestibular schwannoma ([place]) 12/06/2012  . Ringing in right ear 10/12/2012  . Hip pain 07/07/2012  . Family hx of colon cancer 09/29/2010  . DANDRUFF 08/07/2008  . LIPOMA 03/25/2008  . Hyperlipidemia 08/24/2006  . Essential hypertension 08/24/2006   Past Medical History:  Diagnosis Date  . Acoustic neuroma ([place])   . Hearing loss    right ear -no hearing aids  .  Hyperlipidemia   . Hypertension     Family History  Problem Relation Age of Onset  . Pancreatic cancer Father   . Colon polyps Father   . Colon cancer Father        late 17's  . Colon cancer Paternal Grandmother        early 81's  . Heart disease Mother     Past Surgical History:  Procedure Laterality Date  . COLONOSCOPY    . KNEE SURGERY     left  . TOTAL HIP ARTHROPLASTY Left 06/13/2020   Procedure: LEFT TOTAL HIP ARTHROPLASTY ANTERIOR APPROACH;  Surgeon: Leandrew Koyanagi, MD;  Location: [facility];  Service: Orthopedics;  Laterality: Left;  3C  . WISDOM TOOTH EXTRACTION     Social History   Occupational History  . Not on file  Tobacco Use  . Smoking status: Former Smoker    Types: Cigarettes    Quit date: 10/13/1995    Years since quitting: 24.7  . Smokeless tobacco: Never Used  . Tobacco comment: social smoker - quit 1997  Vaping Use  . Vaping Use: Never used  Substance and Sexual Activity  . Alcohol use: Yes    Alcohol/week: 2.0 standard drinks    Types: 2 Standard drinks or equivalent per week    Comment: 2-4 drinks per week per pt  . Drug use: No  .  Sexual activity: Not on file

## 2020-07-24 NOTE — Telephone Encounter (Signed)
emailed

## 2020-07-24 NOTE — Telephone Encounter (Signed)
Ok to do

## 2020-09-02 ENCOUNTER — Ambulatory Visit (INDEPENDENT_AMBULATORY_CARE_PROVIDER_SITE_OTHER): Payer: BC Managed Care – PPO | Admitting: Orthopaedic Surgery

## 2020-09-02 ENCOUNTER — Encounter: Payer: Self-pay | Admitting: Orthopaedic Surgery

## 2020-09-02 DIAGNOSIS — Z96642 Presence of left artificial hip joint: Secondary | ICD-10-CM

## 2020-09-02 MED ORDER — AMOXICILLIN 500 MG PO CAPS: 2000.0000 mg | ORAL_CAPSULE | Freq: Once | ORAL | 6 refills | Status: AC

## 2020-09-02 NOTE — Progress Notes (Signed)
Post-Op Visit Note   Patient: Gabriel Oconnell           Date of Birth: [birth year]           MRN: [id number] Visit Date: 09/02/2020 PCP: Darreld Mclean, MD   Assessment & Plan:  Chief Complaint:  Chief Complaint  Patient presents with  . Left Hip - Routine Post Op   Visit Diagnoses:  1. Status post total replacement of left hip     Plan:   Gabriel Oconnell is 37-month status post left total hip replacement on 06/13/2020.  He is doing well has no complaints.  He is very happy with his recovery.  He is been active.  Left hip surgical scar is fully healed.  He is walking well.  This point we will have him follow-up in approximately 9 months but he will likely follow-up around August or September for planned right hip replacement.  Follow-Up Instructions: Return if symptoms worsen or fail to improve.   Orders:  No orders of the defined types were placed in this encounter.  Meds ordered this encounter  Medications  . amoxicillin (AMOXIL) 500 MG capsule    Sig: Take 4 capsules (2,000 mg total) by mouth once for 1 dose.    Dispense:  4 capsule    Refill:  6    Imaging: No results found.  PMFS History: Patient Active Problem List   Diagnosis Date Noted  . Status post total replacement of left hip 06/13/2020  . Chronic left-sided low back pain with left-sided sciatica 02/05/2020  . Prediabetes 01/24/2020  . Obesity 07/03/2015  . Right foot pain 04/30/2015  . Left foot pain 06/27/2014  . Vestibular schwannoma ([place]) 12/06/2012  . Ringing in right ear 10/12/2012  . Hip pain 07/07/2012  . Family hx of colon cancer 09/29/2010  . DANDRUFF 08/07/2008  . LIPOMA 03/25/2008  . Hyperlipidemia 08/24/2006  . Essential hypertension 08/24/2006   Past Medical History:  Diagnosis Date  . Acoustic neuroma ([place])   . Hearing loss    right ear -no hearing aids  . Hyperlipidemia   . Hypertension     Family History  Problem Relation Age of Onset  . Pancreatic cancer Father   . Colon  polyps Father   . Colon cancer Father        late 66's  . Colon cancer Paternal Grandmother        early 26's  . Heart disease Mother     Past Surgical History:  Procedure Laterality Date  . COLONOSCOPY    . KNEE SURGERY     left  . TOTAL HIP ARTHROPLASTY Left 06/13/2020   Procedure: LEFT TOTAL HIP ARTHROPLASTY ANTERIOR APPROACH;  Surgeon: Leandrew Koyanagi, MD;  Location: [facility];  Service: Orthopedics;  Laterality: Left;  3C  . WISDOM TOOTH EXTRACTION     Social History   Occupational History  . Not on file  Tobacco Use  . Smoking status: Former Smoker    Types: Cigarettes    Quit date: 10/13/1995    Years since quitting: 24.9  . Smokeless tobacco: Never Used  . Tobacco comment: social smoker - quit 1997  Vaping Use  . Vaping Use: Never used  Substance and Sexual Activity  . Alcohol use: Yes    Alcohol/week: 2.0 standard drinks    Types: 2 Standard drinks or equivalent per week    Comment: 2-4 drinks per week per pt  . Drug use: No  . Sexual activity: Not on  file

## 2020-09-18 ENCOUNTER — Other Ambulatory Visit: Payer: Self-pay | Admitting: Family Medicine

## 2020-09-18 DIAGNOSIS — I10 Essential (primary) hypertension: Secondary | ICD-10-CM

## 2020-09-19 ENCOUNTER — Encounter: Payer: Self-pay | Admitting: Family Medicine

## 2020-09-23 ENCOUNTER — Encounter: Payer: Self-pay | Admitting: Family Medicine

## 2020-12-06 ENCOUNTER — Encounter: Payer: Self-pay | Admitting: Family Medicine

## 2020-12-30 ENCOUNTER — Encounter: Payer: Self-pay | Admitting: Orthopaedic Surgery

## 2020-12-30 ENCOUNTER — Other Ambulatory Visit: Payer: Self-pay

## 2020-12-30 ENCOUNTER — Ambulatory Visit: Payer: Self-pay

## 2020-12-30 ENCOUNTER — Ambulatory Visit (INDEPENDENT_AMBULATORY_CARE_PROVIDER_SITE_OTHER): Payer: BC Managed Care – PPO | Admitting: Orthopaedic Surgery

## 2020-12-30 VITALS — Ht 72.0 in | Wt 251.0 lb

## 2020-12-30 DIAGNOSIS — M1611 Unilateral primary osteoarthritis, right hip: Secondary | ICD-10-CM | POA: Diagnosis not present

## 2020-12-30 DIAGNOSIS — Z96642 Presence of left artificial hip joint: Secondary | ICD-10-CM | POA: Diagnosis not present

## 2020-12-30 NOTE — Progress Notes (Signed)
Office Visit Note   Patient: Gabriel Oconnell           Date of Birth: 03-05-1966           MRN: [id number] Visit Date: 12/30/2020              Requested by: Darreld Mclean, MD [address] [place],  [place] [address] PCP: Darreld Mclean, MD   Assessment & Plan: Visit Diagnoses:  1. Primary osteoarthritis of right hip   2. Status post total replacement of left hip     Plan: Gabriel Oconnell has done very well from his left hip replacement.  In terms of the right hip he does have end-stage DJD and he has been managing this with conservative treatments for the last year and a half.  Unfortunately he has now reached a point where the pain is no longer bearable and he would like to move forward with a right total hip replacement in the near future.  Questions encouraged and answered.  Follow-Up Instructions: Return for postop.   Orders:  Orders Placed This Encounter  Procedures   XR Pelvis 1-2 Views   No orders of the defined types were placed in this encounter.     Procedures: No procedures performed   Clinical Data: No additional findings.   Subjective: Chief Complaint  Patient presents with   Right Hip - Follow-up    Gabriel Oconnell comes in today for follow-up of chronic right hip and groin pain due to DJD.  He has been having problems with his hip even before his left hip replacement was performed.  His left hip was replaced in January 2021 and he has been very happy with this.  Recently the right hip has rapidly deteriorated.  His pain is severe and he is unable to do ADLs without significant pain.  He is not interested in cortisone injections as the relief is only temporary.  He is having trouble getting out of his car.  He has nighttime pain.   Review of Systems  Constitutional: Negative.   All other systems reviewed and are negative.   Objective: Vital Signs: Ht 6' (1.829 m)   Wt 251 lb (113.9 kg)   BMI 34.04 kg/m   Physical Exam Vitals and nursing note  reviewed.  Constitutional:      Appearance: He is well-developed.  HENT:     Head: Normocephalic and atraumatic.  Eyes:     Pupils: Pupils are equal, round, and reactive to light.  Pulmonary:     Effort: Pulmonary effort is normal.  Abdominal:     Palpations: Abdomen is soft.  Musculoskeletal:        General: Normal range of motion.     Cervical back: Neck supple.  Skin:    General: Skin is warm.  Neurological:     Mental Status: He is alert and oriented to person, place, and time.  Psychiatric:        Behavior: Behavior normal.        Thought Content: Thought content normal.        Judgment: Judgment normal.    Ortho Exam Right hip exam shows limited range of motion with pain and crepitus.  Antalgic gait.  Positive Stinchfield sign.  Positive FADIR. Left hip exam is stable. Specialty Comments:  No specialty comments available.  Imaging: XR Pelvis 1-2 Views  Result Date: 12/30/2020 Stable total hip replacement without complications.  Advanced DJD of the right hip  PMFS History: Patient Active Problem List   Diagnosis Date Noted   Status post total replacement of left hip 06/13/2020   Chronic left-sided low back pain with left-sided sciatica 02/05/2020   Prediabetes 01/24/2020   Obesity 07/03/2015   Right foot pain 04/30/2015   Left foot pain 06/27/2014   Vestibular schwannoma ([place]) 12/06/2012   Ringing in right ear 10/12/2012   Hip pain 07/07/2012   Family hx of colon cancer 09/29/2010   DANDRUFF 08/07/2008   LIPOMA 03/25/2008   Hyperlipidemia 08/24/2006   Essential hypertension 08/24/2006   Past Medical History:  Diagnosis Date   Acoustic neuroma ([place])    Hearing loss    right ear -no hearing aids   Hyperlipidemia    Hypertension     Family History  Problem Relation Age of Onset   Pancreatic cancer Father    Colon polyps Father    Colon cancer Father        late 49's   Colon cancer Paternal Grandmother        early 65's   Heart disease Mother      Past Surgical History:  Procedure Laterality Date   COLONOSCOPY     KNEE SURGERY     left   TOTAL HIP ARTHROPLASTY Left 06/13/2020   Procedure: LEFT TOTAL HIP ARTHROPLASTY ANTERIOR APPROACH;  Surgeon: Leandrew Koyanagi, MD;  Location: [facility];  Service: Orthopedics;  Laterality: Left;  3C   WISDOM TOOTH EXTRACTION     Social History   Occupational History   Not on file  Tobacco Use   Smoking status: Former    Types: Cigarettes    Quit date: 10/13/1995    Years since quitting: 25.2   Smokeless tobacco: Never   Tobacco comments:    social smoker - quit 1997  Vaping Use   Vaping Use: Never used  Substance and Sexual Activity   Alcohol use: Yes    Alcohol/week: 2.0 standard drinks    Types: 2 Standard drinks or equivalent per week    Comment: 2-4 drinks per week per pt   Drug use: No   Sexual activity: Not on file

## 2021-01-19 ENCOUNTER — Other Ambulatory Visit: Payer: Self-pay | Admitting: Family Medicine

## 2021-01-19 DIAGNOSIS — E785 Hyperlipidemia, unspecified: Secondary | ICD-10-CM

## 2021-01-22 NOTE — Progress Notes (Addendum)
[place] [facility] [address], [place], [place] [address] [phone]  Date:  01/26/2021   Name:  Gabriel Oconnell   DOB:  1965/07/03   MRN:  [id number]  PCP:  Darreld Mclean, MD    Chief Complaint: Annual Exam   History of Present Illness:  Gabriel Oconnell is a 55 y.o. very pleasant male patient who presents with the following:  Patient seen today for physical exam and follow-up Most recent visit with myself was about 1 year ago He has history of hypertension, hyperlipidemia, overweight, prediabetes, vestibular schwannoma, diagnosed due to right-sided tinnitus. This is followed by an ENT specialist in [place] he is seen every 2 years and has MRI monitoring.  This got delayed due to covid, etc.   Married to [place]  His children are 14 and 72- they are both doing well His 43 yo daughter is a Marine scientist His son is a Teaching laboratory technician, finishing up this year at [employer] booster- he did one booster dose  Shingrix done  Colon cancer screening due this fall- reminded pt, he may try to do during his post- op recovery time (hip replacement, see below)  Most recent labs-BMP and CBC in January-he had his left hip replaced per Dr. Erlinda Hong at that time, and is planned to have his right hip replaced in October.  He notes a terrific result from his hip surgery and is very pleased    He is able to do some exercise- his native hip does hold him back some but not terribly He did a 3 mile hike last weekend - a little sore but not bad No CP or SOB with exercise   Amlodipine Asa 81 Simvastatin 20 Valsartan/HCTZ Patient Active Problem List   Diagnosis Date Noted   Status post total replacement of left hip 06/13/2020   Chronic left-sided low back pain with left-sided sciatica 02/05/2020   Prediabetes 01/24/2020   Obesity 07/03/2015   Right foot pain 04/30/2015   Left foot pain 06/27/2014   Vestibular schwannoma ([place])  12/06/2012   Ringing in right ear 10/12/2012   Hip pain 07/07/2012   Family hx of colon cancer 09/29/2010   DANDRUFF 08/07/2008   LIPOMA 03/25/2008   Hyperlipidemia 08/24/2006   Essential hypertension 08/24/2006    Past Medical History:  Diagnosis Date   Acoustic neuroma ([place])    Hearing loss    right ear -no hearing aids   Hyperlipidemia    Hypertension     Past Surgical History:  Procedure Laterality Date   COLONOSCOPY     KNEE SURGERY     left   TOTAL HIP ARTHROPLASTY Left 06/13/2020   Procedure: LEFT TOTAL HIP ARTHROPLASTY ANTERIOR APPROACH;  Surgeon: Leandrew Koyanagi, MD;  Location: [facility];  Service: Orthopedics;  Laterality: Left;  3C   WISDOM TOOTH EXTRACTION      Social History   Tobacco Use   Smoking status: Former    Types: Cigarettes    Quit date: 10/13/1995    Years since quitting: 25.3   Smokeless tobacco: Never   Tobacco comments:    social smoker - quit 1997  Vaping Use   Vaping Use: Never used  Substance Use Topics   Alcohol use: Yes    Alcohol/week: 2.0 standard drinks    Types: 2 Standard drinks or equivalent per week    Comment: 2-4 drinks per week per pt   Drug use: No  Family History  Problem Relation Age of Onset   Pancreatic cancer Father    Colon polyps Father    Colon cancer Father        late 38's   Colon cancer Paternal Grandmother        early 76's   Heart disease Mother     No Known Allergies  Medication list has been reviewed and updated.  Current Outpatient Medications on File Prior to Visit  Medication Sig Dispense Refill   amLODipine (NORVASC) 10 MG tablet TAKE 1 TABLET DAILY 90 tablet 1   APPLE CIDER VINEGAR PO Take 15 mLs by mouth daily.     aspirin EC 81 MG tablet Take 1 tablet (81 mg total) by mouth 2 (two) times daily. 84 tablet 0   cyclobenzaprine (FLEXERIL) 10 MG tablet Take 1 tablet (10 mg total) by mouth 2 (two) times daily as needed for muscle spasms. 30 tablet 0   docusate sodium (COLACE) 100 MG capsule  Take 1 capsule (100 mg total) by mouth daily as needed. 30 capsule 2   Multiple Vitamins-Minerals (MULTIVITAMIN,TX-MINERALS) tablet Take 1 tablet by mouth daily.     simvastatin (ZOCOR) 20 MG tablet Take 1 tablet (20 mg total) by mouth daily. TAKE 1 TABLET DAILY 30 tablet 0   valsartan-hydrochlorothiazide (DIOVAN-HCT) 320-12.5 MG tablet Take 1 tablet by mouth daily. TAKE 1 TABLET DAILY 90 tablet 1   No current facility-administered medications on file prior to visit.    Review of Systems:  As per HPI- otherwise negative.   Physical Examination: Vitals:   01/26/21 0842  BP: 122/86  Pulse: 74  Resp: 17  SpO2: 98%   Vitals:   01/26/21 0842  Weight: 252 lb (114.3 kg)  Height: 6' (1.829 m)   Body mass index is 34.18 kg/m. Ideal Body Weight: Weight in (lb) to have BMI = 25: 183.9  GEN: no acute distress. Obese, looks well  HEENT: Atraumatic, Normocephalic.   Bilateral TM wnl, oropharynx normal.  PEERL,EOMI.   Ears and Nose: No external deformity. CV: RRR, No M/G/R. No JVD. No thrill. No extra heart sounds. PULM: CTA B, no wheezes, crackles, rhonchi. No retractions. No resp. distress. No accessory muscle use. ABD: S, NT, ND, +BS. No rebound. No HSM. EXTR: No c/c/e PSYCH: Normally interactive. Conversant.    Assessment and Plan: Physical exam  Mixed hyperlipidemia - Plan: Lipid panel  Screening for diabetes mellitus - Plan: Comprehensive metabolic panel, Hemoglobin A1c  Essential hypertension - Plan: CBC, Comprehensive metabolic panel, amLODipine (NORVASC) 10 MG tablet, valsartan-hydrochlorothiazide (DIOVAN-HCT) 320-12.5 MG tablet  Screening for prostate cancer - Plan: PSA  Hyperlipidemia - Plan: simvastatin (ZOCOR) 20 MG tablet  Physical exam today.  Encouraged healthy diet and exercise routine Will plan further follow- up pending labs. Refilled meds Discussed colon cancer screening and I sent a message to his GI doctor -if possible he would like to get this done  while he is recovering from hip surgery  This visit occurred during the SARS-CoV-2 public health emergency.  Safety protocols were in place, including screening questions prior to the visit, additional usage of staff PPE, and extensive cleaning of exam room while observing appropriate contact time as indicated for disinfecting solutions.   Signed Lamar Blinks, MD  Received labs as below, message to patient  Results for orders placed or performed in visit on 01/26/21  CBC  Result Value Ref Range   WBC 7.2 4.0 - 10.5 K/uL   RBC 4.39 4.22 - 5.81 Mil/uL  Platelets 266.0 150.0 - 400.0 K/uL   Hemoglobin 14.2 13.0 - 17.0 g/dL   HCT 41.9 39.0 - 52.0 %   MCV 95.3 78.0 - 100.0 fl   MCHC 34.0 30.0 - 36.0 g/dL   RDW 13.0 11.5 - 15.5 %  Comprehensive metabolic panel  Result Value Ref Range   Sodium 140 135 - 145 mEq/L   Potassium 4.0 3.5 - 5.1 mEq/L   Chloride 102 96 - 112 mEq/L   CO2 28 19 - 32 mEq/L   Glucose, Bld 97 70 - 99 mg/dL   BUN 11 6 - 23 mg/dL   Creatinine, Ser 0.93 0.40 - 1.50 mg/dL   Total Bilirubin 0.6 0.2 - 1.2 mg/dL   Alkaline Phosphatase 65 39 - 117 U/L   AST 19 0 - 37 U/L   ALT 20 0 - 53 U/L   Total Protein 7.1 6.0 - 8.3 g/dL   Albumin 4.3 3.5 - 5.2 g/dL   GFR 92.43 >60.00 mL/min   Calcium 9.4 8.4 - 10.5 mg/dL  Hemoglobin A1c  Result Value Ref Range   Hgb A1c MFr Bld 5.9 4.6 - 6.5 %  Lipid panel  Result Value Ref Range   Cholesterol 161 0 - 200 mg/dL   Triglycerides 113.0 0.0 - 149.0 mg/dL   HDL 44.50 >39.00 mg/dL   VLDL 22.6 0.0 - 40.0 mg/dL   LDL Cholesterol 94 0 - 99 mg/dL   Total CHOL/HDL Ratio 4    NonHDL 116.54   PSA  Result Value Ref Range   PSA 1.78 0.10 - 4.00 ng/mL

## 2021-01-26 ENCOUNTER — Telehealth: Payer: Self-pay | Admitting: Orthopaedic Surgery

## 2021-01-26 ENCOUNTER — Encounter: Payer: Self-pay | Admitting: Family Medicine

## 2021-01-26 ENCOUNTER — Telehealth: Payer: Self-pay

## 2021-01-26 ENCOUNTER — Ambulatory Visit (INDEPENDENT_AMBULATORY_CARE_PROVIDER_SITE_OTHER): Payer: BC Managed Care – PPO | Admitting: Family Medicine

## 2021-01-26 ENCOUNTER — Other Ambulatory Visit: Payer: Self-pay

## 2021-01-26 VITALS — BP 122/86 | HR 74 | Resp 17 | Ht 72.0 in | Wt 252.0 lb

## 2021-01-26 DIAGNOSIS — Z0000 Encounter for general adult medical examination without abnormal findings: Secondary | ICD-10-CM | POA: Diagnosis not present

## 2021-01-26 DIAGNOSIS — I10 Essential (primary) hypertension: Secondary | ICD-10-CM

## 2021-01-26 DIAGNOSIS — Z125 Encounter for screening for malignant neoplasm of prostate: Secondary | ICD-10-CM

## 2021-01-26 DIAGNOSIS — Z131 Encounter for screening for diabetes mellitus: Secondary | ICD-10-CM

## 2021-01-26 DIAGNOSIS — R972 Elevated prostate specific antigen [PSA]: Secondary | ICD-10-CM

## 2021-01-26 DIAGNOSIS — E782 Mixed hyperlipidemia: Secondary | ICD-10-CM

## 2021-01-26 DIAGNOSIS — E785 Hyperlipidemia, unspecified: Secondary | ICD-10-CM

## 2021-01-26 LAB — LIPID PANEL
Cholesterol: 161 mg/dL (ref 0–200)
HDL: 44.5 mg/dL (ref >39.00)
LDL Cholesterol: 94 mg/dL (ref 0–99)
NonHDL: 116.54
Total CHOL/HDL Ratio: 4
Triglycerides: 113 mg/dL (ref 0.0–149.0)
VLDL: 22.6 mg/dL (ref 0.0–40.0)

## 2021-01-26 LAB — COMPREHENSIVE METABOLIC PANEL
ALT: 20 U/L (ref 0–53)
AST: 19 U/L (ref 0–37)
Albumin: 4.3 g/dL (ref 3.5–5.2)
Alkaline Phosphatase: 65 U/L (ref 39–117)
BUN: 11 mg/dL (ref 6–23)
CO2: 28 mEq/L (ref 19–32)
Calcium: 9.4 mg/dL (ref 8.4–10.5)
Chloride: 102 mEq/L (ref 96–112)
Creatinine, Ser: 0.93 mg/dL (ref 0.40–1.50)
GFR: 92.43 mL/min (ref >60.00)
Glucose, Bld: 97 mg/dL (ref 70–99)
Potassium: 4 mEq/L (ref 3.5–5.1)
Sodium: 140 mEq/L (ref 135–145)
Total Bilirubin: 0.6 mg/dL (ref 0.2–1.2)
Total Protein: 7.1 g/dL (ref 6.0–8.3)

## 2021-01-26 LAB — CBC
HCT: 41.9 % (ref 39.0–52.0)
Hemoglobin: 14.2 g/dL (ref 13.0–17.0)
MCHC: 34 g/dL (ref 30.0–36.0)
MCV: 95.3 fl (ref 78.0–100.0)
Platelets: 266 10*3/uL (ref 150.0–400.0)
RBC: 4.39 Mil/uL (ref 4.22–5.81)
RDW: 13 % (ref 11.5–15.5)
WBC: 7.2 10*3/uL (ref 4.0–10.5)

## 2021-01-26 LAB — HEMOGLOBIN A1C: Hgb A1c MFr Bld: 5.9 % (ref 4.6–6.5)

## 2021-01-26 LAB — PSA: PSA: 1.78 ng/mL (ref 0.10–4.00)

## 2021-01-26 MED ORDER — SIMVASTATIN 20 MG PO TABS: 20.0000 mg | ORAL_TABLET | Freq: Every day | ORAL | 3 refills | Status: DC

## 2021-01-26 MED ORDER — VALSARTAN-HYDROCHLOROTHIAZIDE 320-12.5 MG PO TABS: 1.0000 | ORAL_TABLET | Freq: Every day | ORAL | 3 refills | Status: DC

## 2021-01-26 MED ORDER — AMLODIPINE BESYLATE 10 MG PO TABS: 10.0000 mg | ORAL_TABLET | Freq: Every day | ORAL | 3 refills | Status: DC

## 2021-01-26 NOTE — Telephone Encounter (Signed)
Left message on machine to call back  

## 2021-01-26 NOTE — Addendum Note (Signed)
Addended by: Lamar Blinks C on: 01/26/2021 06:00 PM   Modules accepted: Orders

## 2021-01-26 NOTE — Patient Instructions (Signed)
It was good to see you again today- take care and have a wonderful fall season!  I will be in touch with your labs I will reach out to Dr Ardis Hughs with GI about setting up your colonoscopy for after your hip surgery  Plan to do flu shot and covid booster this fall

## 2021-01-26 NOTE — Telephone Encounter (Signed)
Received $25.00 cash,medical records release form and Healthcare Provider paperwork from patient     Forwarding to [facility] today

## 2021-01-26 NOTE — Telephone Encounter (Signed)
-----   Message from Milus Banister, MD sent at 01/26/2021 10:21 AM EDT ----- I understand his intention but I'm not sure it's safe to do so soon after a hip replacement.  Most patients prefer to wait a few months.   Wyndham Santilli, Can you contact him and as that he clear this idea with his orthopedic surgeon first and then get back to [place].   Thanks all  ----- Message ----- From: Darreld Mclean, MD Sent: 01/26/2021   9:01 AM EDT To: Milus Banister, MD  Jacqulyn Cane- I hope you are doing well!  This patient needs his 5 year colon in November.  He is getting his right hip replaced 10/28 and would like to do his colon during his 6 week post- op break from work if feasible?  Would you be so kind as to have your staff reach out to him?  Thanks so much  JC

## 2021-01-27 NOTE — Telephone Encounter (Signed)
The pt has been advised and will call when ortho has released him to proceed.  FYI Dr Lorelei Pont

## 2021-02-05 ENCOUNTER — Other Ambulatory Visit: Payer: Self-pay

## 2021-02-09 ENCOUNTER — Telehealth: Payer: Self-pay | Admitting: Orthopaedic Surgery

## 2021-02-09 NOTE — Telephone Encounter (Signed)
Pt submitted medical release form,Short term disability forms, and $25.00 cash payment to [employer]. Accepted 02/09/21

## 2021-03-04 ENCOUNTER — Encounter: Payer: Self-pay | Admitting: Family Medicine

## 2021-03-10 ENCOUNTER — Encounter: Payer: Self-pay | Admitting: Gastroenterology

## 2021-03-16 ENCOUNTER — Encounter: Payer: Self-pay | Admitting: Orthopaedic Surgery

## 2021-03-16 ENCOUNTER — Other Ambulatory Visit: Payer: Self-pay

## 2021-03-16 MED ORDER — AMOXICILLIN 500 MG PO CAPS: 2000.0000 mg | ORAL_CAPSULE | ORAL | 0 refills | Status: DC

## 2021-03-17 NOTE — Pre-Procedure Instructions (Signed)
Surgical Instructions   Your procedure is scheduled on Friday, October 28th. Report to [facility] Main Entrance "A" at 10:00 A.M., then check in with the Admitting office. Call this number if you have problems the morning of surgery: [phone]   If you have any questions prior to your surgery date call [phone]: Open Monday-Friday 8am-4pm   Remember: Do not eat after midnight the night before your surgery  You may drink clear liquids until 09:00 AM the morning of your surgery.   Clear liquids allowed are: Water, Non-Citrus Juices (without pulp), Carbonated Beverages, Clear Tea, Black Coffee Only, and Gatorade   Patient Instructions  The night before surgery:  No food after midnight. ONLY clear liquids after midnight  The day of surgery (if you do NOT have diabetes):  Drink ONE (1) Pre-Surgery Clear Ensure by 09:00 AM the morning of surgery. Drink in one sitting. Do not sip.  This drink was given to you during your hospital  pre-op appointment visit.  Nothing else to drink after completing the  Pre-Surgery Clear Ensure.          If you have questions, please contact your surgeon's office.       Take these medicines the morning of surgery with A SIP OF WATER  amLODipine (NORVASC)  cetirizine (ZYRTEC)  simvastatin (ZOCOR)    If needed: acetaminophen (TYLENOL)   As of today, STOP taking any Aspirin (unless otherwise instructed by your surgeon) Aleve, Naproxen, Ibuprofen, Motrin, Advil, Goody's, BC's, all herbal medications, fish oil, and all vitamins.                     Do NOT Smoke (Tobacco/Vaping) or drink Alcohol 24 hours prior to your procedure.  If you use a CPAP at night, you may bring all equipment for your overnight stay.   Contacts, glasses, piercing's, hearing aid's, dentures or partials may not be worn into surgery, please bring cases for these belongings.    For patients admitted to the hospital, discharge time will be determined by your treatment  team.   Patients discharged the day of surgery will not be allowed to drive home, and someone needs to stay with them for 24 hours.  NO VISITORS WILL BE ALLOWED IN PRE-OP WHERE PATIENTS GET READY FOR SURGERY.  ONLY 1 SUPPORT PERSON MAY BE PRESENT IN THE WAITING ROOM WHILE YOU ARE IN SURGERY.  IF YOU ARE TO BE ADMITTED, ONCE YOU ARE IN YOUR ROOM YOU WILL BE ALLOWED TWO (2) VISITORS.  Minor children may have two parents present. Special consideration for safety and communication needs will be reviewed on a case by case basis.   Special instructions:   - Preparing For Surgery  Before surgery, you can play an important role. Because skin is not sterile, your skin needs to be as free of germs as possible. You can reduce the number of germs on your skin by washing with CHG (chlorahexidine gluconate) Soap before surgery.  CHG is an antiseptic cleaner which kills germs and bonds with the skin to continue killing germs even after washing.    Oral Hygiene is also important to reduce your risk of infection.  Remember - BRUSH YOUR TEETH THE MORNING OF SURGERY WITH YOUR REGULAR TOOTHPASTE  Please do not use if you have an allergy to CHG or antibacterial soaps. If your skin becomes reddened/irritated stop using the CHG.  Do not shave (including legs and underarms) for at least 48 hours prior to first CHG  shower. It is OK to shave your face.  Please follow these instructions carefully.   Shower the NIGHT BEFORE SURGERY and the MORNING OF SURGERY  If you chose to wash your hair, wash your hair first as usual with your normal shampoo.  After you shampoo, rinse your hair and body thoroughly to remove the shampoo.  Use CHG Soap as you would any other liquid soap. You can apply CHG directly to the skin and wash gently with a scrungie or a clean washcloth.   Apply the CHG Soap to your body ONLY FROM THE NECK DOWN.  Do not use on open wounds or open sores. Avoid contact with your eyes, ears, mouth  and genitals (private parts). Wash Face and genitals (private parts)  with your normal soap.   Wash thoroughly, paying special attention to the area where your surgery will be performed.  Thoroughly rinse your body with warm water from the neck down.  DO NOT shower/wash with your normal soap after using and rinsing off the CHG Soap.  Pat yourself dry with a CLEAN TOWEL.  Wear CLEAN PAJAMAS to bed the night before surgery  Place CLEAN SHEETS on your bed the night before your surgery  DO NOT SLEEP WITH PETS.   Day of Surgery: Shower with CHG soap. Do not wear jewelry Do not wear lotions, powders, colognes, or deodorant. Men may shave face and neck. Do not bring valuables to the hospital. [facility] is not responsible for any belongings or valuables. Wear Clean/Comfortable clothing the morning of surgery Remember to brush your teeth WITH YOUR REGULAR TOOTHPASTE.   Please read over the following fact sheets that you were given.   3 days prior to your procedure or After your COVID test   You are not required to quarantine however you are required to wear a well-fitting mask when you are out and around people not in your household. If your mask becomes wet or soiled, replace with a new one.   Wash your hands often with soap and water for 20 seconds or clean your hands with an alcohol-based hand sanitizer that contains at least 60% alcohol.   Do not share personal items.   Notify your provider:  o if you are in close contact with someone who has COVID  o or if you develop a fever of 100.4 or greater, sneezing, cough, sore throat, shortness of breath or body aches.

## 2021-03-18 ENCOUNTER — Encounter (HOSPITAL_COMMUNITY)
Admission: RE | Admit: 2021-03-18 | Discharge: 2021-03-18 | Disposition: A | Discharge status: Home / Self Care | Payer: BC Managed Care – PPO | Source: Ambulatory Visit | Attending: Orthopaedic Surgery | Admitting: Orthopaedic Surgery

## 2021-03-18 ENCOUNTER — Other Ambulatory Visit: Payer: Self-pay

## 2021-03-18 ENCOUNTER — Encounter (HOSPITAL_COMMUNITY): Payer: Self-pay

## 2021-03-18 ENCOUNTER — Other Ambulatory Visit: Payer: Self-pay | Admitting: Physician Assistant

## 2021-03-18 VITALS — BP 135/95 | HR 78 | Temp 98.6°F | Resp 19 | Ht 72.0 in | Wt 254.0 lb

## 2021-03-18 DIAGNOSIS — Z01818 Encounter for other preprocedural examination: Secondary | ICD-10-CM | POA: Insufficient documentation

## 2021-03-18 DIAGNOSIS — E119 Type 2 diabetes mellitus without complications: Secondary | ICD-10-CM | POA: Diagnosis not present

## 2021-03-18 HISTORY — DX: Other specified postprocedural states: Z98.890

## 2021-03-18 HISTORY — DX: Other specified postprocedural states: R11.2

## 2021-03-18 LAB — CBC WITH DIFFERENTIAL/PLATELET
Abs Immature Granulocytes: 0.03 10*3/uL (ref 0.00–0.07)
Basophils Absolute: 0.1 10*3/uL (ref 0.0–0.1)
Basophils Relative: 1 %
Eosinophils Absolute: 0.2 10*3/uL (ref 0.0–0.5)
Eosinophils Relative: 2 %
HCT: 42.2 % (ref 39.0–52.0)
Hemoglobin: 14.5 g/dL (ref 13.0–17.0)
Immature Granulocytes: 0 %
Lymphocytes Relative: 34 %
Lymphs Abs: 3.2 10*3/uL (ref 0.7–4.0)
MCH: 32.8 pg (ref 26.0–34.0)
MCHC: 34.4 g/dL (ref 30.0–36.0)
MCV: 95.5 fL (ref 80.0–100.0)
Monocytes Absolute: 0.9 10*3/uL (ref 0.1–1.0)
Monocytes Relative: 10 %
Neutro Abs: 5.1 10*3/uL (ref 1.7–7.7)
Neutrophils Relative %: 53 %
Platelets: 299 10*3/uL (ref 150–400)
RBC: 4.42 MIL/uL (ref 4.22–5.81)
RDW: 12.9 % (ref 11.5–15.5)
WBC: 9.4 10*3/uL (ref 4.0–10.5)
nRBC: 0 % (ref 0.0–0.2)

## 2021-03-18 LAB — COMPREHENSIVE METABOLIC PANEL
ALT: 25 U/L (ref 0–44)
AST: 25 U/L (ref 15–41)
Albumin: 4.3 g/dL (ref 3.5–5.0)
Alkaline Phosphatase: 67 U/L (ref 38–126)
Anion gap: 8 (ref 5–15)
BUN: 9 mg/dL (ref 6–20)
CO2: 28 mmol/L (ref 22–32)
Calcium: 9.3 mg/dL (ref 8.9–10.3)
Chloride: 103 mmol/L (ref 98–111)
Creatinine, Ser: 0.94 mg/dL (ref 0.61–1.24)
GFR, Estimated: >60 mL/min (ref >60)
Glucose, Bld: 107 mg/dL — ABNORMAL HIGH (ref 70–99)
Potassium: 3.4 mmol/L — ABNORMAL LOW (ref 3.5–5.1)
Sodium: 139 mmol/L (ref 135–145)
Total Bilirubin: 0.6 mg/dL (ref 0.3–1.2)
Total Protein: 7.5 g/dL (ref 6.5–8.1)

## 2021-03-18 LAB — URINALYSIS, ROUTINE W REFLEX MICROSCOPIC
Bilirubin Urine: NEGATIVE
Glucose, UA: NEGATIVE mg/dL
Hgb urine dipstick: NEGATIVE
Ketones, ur: NEGATIVE mg/dL
Leukocytes,Ua: NEGATIVE
Nitrite: NEGATIVE
Protein, ur: NEGATIVE mg/dL
Specific Gravity, Urine: 1.025 (ref 1.005–1.030)
pH: 5 (ref 5.0–8.0)

## 2021-03-18 LAB — TYPE AND SCREEN
ABO/RH(D): B NEG
Antibody Screen: NEGATIVE

## 2021-03-18 LAB — PROTIME-INR
INR: 1 (ref 0.8–1.2)
Prothrombin Time: 13.3 seconds (ref 11.4–15.2)

## 2021-03-18 LAB — SURGICAL PCR SCREEN
MRSA, PCR: NEGATIVE
Staphylococcus aureus: POSITIVE — AB

## 2021-03-18 LAB — APTT: aPTT: 30 seconds (ref 24–36)

## 2021-03-18 MED ORDER — METHOCARBAMOL 500 MG PO TABS
500.0000 mg | ORAL_TABLET | Freq: Two times a day (BID) | ORAL | 0 refills | Status: DC | PRN
Start: 2021-03-18 — End: 2021-09-11

## 2021-03-18 MED ORDER — ASPIRIN EC 81 MG PO TBEC: 81.0000 mg | DELAYED_RELEASE_TABLET | Freq: Two times a day (BID) | ORAL | 0 refills | Status: AC

## 2021-03-18 MED ORDER — DOCUSATE SODIUM 100 MG PO CAPS: 100.0000 mg | ORAL_CAPSULE | Freq: Every day | ORAL | 2 refills | Status: DC | PRN

## 2021-03-18 MED ORDER — OXYCODONE-ACETAMINOPHEN 5-325 MG PO TABS: 1.0000 | ORAL_TABLET | ORAL | 0 refills | Status: DC | PRN

## 2021-03-18 MED ORDER — ONDANSETRON HCL 4 MG PO TABS: 4.0000 mg | ORAL_TABLET | Freq: Three times a day (TID) | ORAL | 0 refills | Status: DC | PRN

## 2021-03-18 NOTE — Progress Notes (Signed)
PCP - Dr. Silvestre Mesi Cardiologist - denies  PPM/ICD - n/a Device Orders - n/a Rep Notified - n/a  Chest x-ray - 06/09/20 EKG - 06/09/20 Stress Test - denies ECHO - denies Cardiac Cath - denies  Sleep Study - denies CPAP- n/a  Diabetic: No  Blood Thinner Instructions: n/a Aspirin Instructions: n/a. To be taken after surgery  ERAS Protcol - Yes PRE-SURGERY Ensure or G2- Ensure  COVID TEST- Day of surgery. Patient works and is unable to come during testing hours.    Anesthesia review: Yes. PONV. Per patient he had severe dry heaves after hip replacement in January.   Patient denies shortness of breath, fever, cough and chest pain at PAT appointment   All instructions explained to the patient, with a verbal understanding of the material. Patient agrees to go over the instructions while at home for a better understanding. Patient also instructed to self quarantine after being tested for COVID-19. The opportunity to ask questions was provided.

## 2021-03-18 NOTE — Pre-Procedure Instructions (Addendum)
Surgical Instructions   Your procedure is scheduled on Friday, October 28th. Report to [facility] Main Entrance "A" at 09:30 A.M., then check in with the Admitting office. You will have your COVID test on the day of surgery per your request.  Call this number if you have problems the morning of surgery: [phone]   If you have any questions prior to your surgery date call [phone]: Open Monday-Friday 8am-4pm   Remember: Do not eat after midnight the night before your surgery  You may drink clear liquids until 09:00 AM the morning of your surgery.   Clear liquids allowed are: Water, Non-Citrus Juices (without pulp), Carbonated Beverages, Clear Tea, Black Coffee Only- No cream or sugar, and Gatorade   Patient Instructions  The night before surgery:  No food after midnight. ONLY clear liquids after midnight  The day of surgery (if you do NOT have diabetes):  Drink ONE (1) Pre-Surgery Clear Ensure by 09:00 AM the morning of surgery. Drink in one sitting. Do not sip.  This drink was given to you during your hospital  pre-op appointment visit.  Nothing else to drink after completing the  Pre-Surgery Clear Ensure.          If you have questions, please contact your surgeon's office.       Take these medicines the morning of surgery with A SIP OF WATER  amLODipine (NORVASC)  cetirizine (ZYRTEC)  simvastatin (ZOCOR)    If needed: acetaminophen (TYLENOL)   As of today, STOP taking any Aspirin (unless otherwise instructed by your surgeon) Aleve, Naproxen, Ibuprofen, Motrin, Advil, Goody's, BC's, all herbal medications, fish oil, and all vitamins.                     Do NOT Smoke (Tobacco/Vaping) or drink Alcohol 24 hours prior to your procedure.  If you use a CPAP at night, you may bring all equipment for your overnight stay.   Contacts, glasses, piercing's, hearing aid's, dentures or partials may not be worn into surgery, please bring cases for these belongings.     For patients admitted to the hospital, discharge time will be determined by your treatment team.   Patients discharged the day of surgery will not be allowed to drive home, and someone needs to stay with them for 24 hours.  NO VISITORS WILL BE ALLOWED IN PRE-OP WHERE PATIENTS GET READY FOR SURGERY.  ONLY 1 SUPPORT PERSON MAY BE PRESENT IN THE WAITING ROOM WHILE YOU ARE IN SURGERY.  IF YOU ARE TO BE ADMITTED, ONCE YOU ARE IN YOUR ROOM YOU WILL BE ALLOWED TWO (2) VISITORS.  Minor children may have two parents present. Special consideration for safety and communication needs will be reviewed on a case by case basis.   Special instructions:   [place]- Preparing For Surgery  Before surgery, you can play an important role. Because skin is not sterile, your skin needs to be as free of germs as possible. You can reduce the number of germs on your skin by washing with CHG (chlorahexidine gluconate) Soap before surgery.  CHG is an antiseptic cleaner which kills germs and bonds with the skin to continue killing germs even after washing.    Oral Hygiene is also important to reduce your risk of infection.  Remember - BRUSH YOUR TEETH THE MORNING OF SURGERY WITH YOUR REGULAR TOOTHPASTE  Please do not use if you have an allergy to CHG or antibacterial soaps. If your skin becomes reddened/irritated stop using  the CHG.  Do not shave (including legs and underarms) for at least 48 hours prior to first CHG shower. It is OK to shave your face.  Please follow these instructions carefully.   Shower the NIGHT BEFORE SURGERY and the MORNING OF SURGERY  If you chose to wash your hair, wash your hair first as usual with your normal shampoo.  After you shampoo, rinse your hair and body thoroughly to remove the shampoo.  Use CHG Soap as you would any other liquid soap. You can apply CHG directly to the skin and wash gently with a scrungie or a clean washcloth.   Apply the CHG Soap to your body ONLY FROM THE NECK  DOWN.  Do not use on open wounds or open sores. Avoid contact with your eyes, ears, mouth and genitals (private parts). Wash Face and genitals (private parts)  with your normal soap.   Wash thoroughly, paying special attention to the area where your surgery will be performed.  Thoroughly rinse your body with warm water from the neck down.  DO NOT shower/wash with your normal soap after using and rinsing off the CHG Soap.  Pat yourself dry with a CLEAN TOWEL.  Wear CLEAN PAJAMAS to bed the night before surgery  Place CLEAN SHEETS on your bed the night before your surgery  DO NOT SLEEP WITH PETS.   Day of Surgery: Shower with CHG soap. Do not wear jewelry Do not wear lotions, powders, colognes, or deodorant. Men may shave face and neck. Do not bring valuables to the hospital. [facility] is not responsible for any belongings or valuables. Wear Clean/Comfortable clothing the morning of surgery Remember to brush your teeth WITH YOUR REGULAR TOOTHPASTE.   Please read over the following fact sheets that you were given.   3 days prior to your procedure or After your COVID test   You are not required to quarantine however you are required to wear a well-fitting mask when you are out and around people not in your household. If your mask becomes wet or soiled, replace with a new one.   Wash your hands often with soap and water for 20 seconds or clean your hands with an alcohol-based hand sanitizer that contains at least 60% alcohol.   Do not share personal items.   Notify your provider:  o if you are in close contact with someone who has COVID  o or if you develop a fever of 100.4 or greater, sneezing, cough, sore throat, shortness of breath or body aches.

## 2021-03-19 NOTE — Anesthesia Preprocedure Evaluation (Deleted)
Anesthesia Evaluation    Airway        Dental   Pulmonary former smoker,           Cardiovascular hypertension,      Neuro/Psych    GI/Hepatic   Endo/Other    Renal/GU      Musculoskeletal   Abdominal   Peds  Hematology   Anesthesia Other Findings   Reproductive/Obstetrics                             Anesthesia Physical Anesthesia Plan  ASA:   Anesthesia Plan:    Post-op Pain Management:    Induction:   PONV Risk Score and Plan:   Airway Management Planned:   Additional Equipment:   Intra-op Plan:   Post-operative Plan:   Informed Consent:   Plan Discussed with:   Anesthesia Plan Comments: (PAT note written 03/19/2021 by Myra Gianotti, PA-C. He had severe dry heaves post-op 05/2020.  )       Anesthesia Quick Evaluation

## 2021-03-19 NOTE — Progress Notes (Signed)
Anesthesia Chart Review:  Case: [id number] Date/Time: 03/27/21 1145   Procedure: RIGHT TOTAL HIP ARTHROPLASTY ANTERIOR APPROACH (Right: Hip) - 3-C   Anesthesia type: Spinal   Pre-op diagnosis: right hip degenerative joint disease   Location: [facility] OR ROOM 06 / [place] [facility]   Surgeons: Leandrew Koyanagi, MD       DISCUSSION: Patient is a 55 year old male scheduled for the above procedure. He is s/p left THA 06/13/20 under MAC and spinal anesthesia. He reported severe dry heaves post-operatively. He was given 4 mg of ondansetron and 10 mg dexamethasone intraoperatively.   History includes former smoker (quit 10/13/95), post-operative N/V, HTN, HLD, acoustic neuroma (right), hearing loss/tinnitus (right ear). BMI is consistent with obesity.  Per 01/26/21 annual exam note by Dr. Lorelei Pont, his risht acoustic neuroma is followed by an ENT in [facility] and gets MRI imaging every two years, although some delay during [place] pandemic. She notes left THA earlier this year and plans for right THA. He was able to do a 3 mile hike in August, although sore. No chest pain or SOB.   He is for COVID-19 testing on the day of surgery due to his work schedule. Anesthesia team to evaluate on the day of surgery.    VS: BP (!) 135/95   Pulse 78   Temp 37 C (Oral)   Resp 19   Ht 6' (1.829 m)   Wt 115.2 kg   SpO2 99%   BMI 34.45 kg/m   PROVIDERS: Copland, Gay Filler, MD is PCP    LABS: Labs reviewed: Acceptable for surgery. (all labs ordered are listed, but only abnormal results are displayed)  Labs Reviewed  SURGICAL PCR SCREEN - Abnormal; Notable for the following components:      Result Value   Staphylococcus aureus POSITIVE (*)    All other components within normal limits  COMPREHENSIVE METABOLIC PANEL - Abnormal; Notable for the following components:   Potassium 3.4 (*)    Glucose, Bld 107 (*)    All other components within normal limits  URINALYSIS, ROUTINE W REFLEX MICROSCOPIC - Abnormal; Notable for the  following components:   APPearance HAZY (*)    All other components within normal limits  CBC WITH DIFFERENTIAL/PLATELET  PROTIME-INR  APTT  TYPE AND SCREEN     IMAGES: CXR 06/09/20: FINDINGS: Normal heart size, mediastinal contours, and pulmonary vascularity. Lungs clear. No pleural effusion or pneumothorax. Bones unremarkable. IMPRESSION: Normal exam.   MRI Brain 02/15/17:  Internal Auditory Canals: Bilobed mass of the right internal auditory canal is unchanged in size, measuring 12 x 6 x 6 mm. Homogeneous contrast enhancement is unchanged. The left internal auditory canal and inner ear are normal. The right cochlea and semicircular canals are normal. There is mild widening of the right porous acoustics this, unchanged. IMPRESSION: 1. Unchanged right CPA/IAC vestibular schwannoma. 2. Otherwise normal MRI of the brain for age.    EKG: 06/09/20: Sinus rhythm with frequent Premature ventricular complexes Nonspecific ST abnormality Abnormal ECG No previous tracing Confirmed by Oswaldo Milian [phone]) on 06/09/2020 9:12:22 PM   CV: N/A  Past Medical History:  Diagnosis Date   Acoustic neuroma ([place])    Hearing loss    right ear -no hearing aids   Hyperlipidemia    Hypertension    PONV (postoperative nausea and vomiting)    Dry Heaves after surgery 2022    Past Surgical History:  Procedure Laterality Date   COLONOSCOPY     KNEE SURGERY  left   TOTAL HIP ARTHROPLASTY Left 06/13/2020   Procedure: LEFT TOTAL HIP ARTHROPLASTY ANTERIOR APPROACH;  Surgeon: Leandrew Koyanagi, MD;  Location: [facility] ORS;  Service: Orthopedics;  Laterality: Left;  3C   WISDOM TOOTH EXTRACTION      MEDICATIONS:  aspirin EC 81 MG tablet   docusate sodium (COLACE) 100 MG capsule   methocarbamol (ROBAXIN) 500 MG tablet   ondansetron (ZOFRAN) 4 MG tablet   oxyCODONE-acetaminophen (PERCOCET) 5-325 MG tablet   acetaminophen (TYLENOL) 500 MG tablet   amLODipine (NORVASC) 10 MG tablet    amoxicillin (AMOXIL) 500 MG capsule   cetirizine (ZYRTEC) 10 MG tablet   Glucosamine HCl 1500 MG TABS   Multiple Vitamin (MULTIVITAMIN WITH MINERALS) TABS tablet   simvastatin (ZOCOR) 20 MG tablet   valsartan-hydrochlorothiazide (DIOVAN-HCT) 320-12.5 MG tablet   No current facility-administered medications for this encounter.  He is not currently taking ASA.   Myra Gianotti, PA-C Surgical Short Stay/Anesthesiology [facility] Phone [phone] [facility] Phone [phone] 03/19/2021 12:04 PM

## 2021-03-20 ENCOUNTER — Other Ambulatory Visit: Payer: Self-pay | Admitting: Physician Assistant

## 2021-03-26 MED ORDER — TRANEXAMIC ACID 1000 MG/10ML IV SOLN
2000.0000 mg | INTRAVENOUS | Status: DC
  Filled 2021-03-26: qty 20

## 2021-03-27 ENCOUNTER — Encounter (HOSPITAL_COMMUNITY): Payer: Self-pay | Admitting: Orthopaedic Surgery

## 2021-03-27 ENCOUNTER — Ambulatory Visit (HOSPITAL_COMMUNITY): Payer: BC Managed Care – PPO

## 2021-03-27 ENCOUNTER — Ambulatory Visit (HOSPITAL_COMMUNITY): Payer: BC Managed Care – PPO | Admitting: Vascular Surgery

## 2021-03-27 ENCOUNTER — Encounter (HOSPITAL_COMMUNITY)
Admission: RE | Disposition: A | Discharge status: Home / Self Care | Payer: Self-pay | Source: Ambulatory Visit | Attending: Orthopaedic Surgery

## 2021-03-27 ENCOUNTER — Observation Stay (HOSPITAL_COMMUNITY): Payer: BC Managed Care – PPO

## 2021-03-27 ENCOUNTER — Observation Stay (HOSPITAL_COMMUNITY)
Admission: RE | Admit: 2021-03-27 | Discharge: 2021-03-27 | Disposition: A | Discharge status: Home / Self Care | Payer: BC Managed Care – PPO | Source: Ambulatory Visit | Attending: Orthopaedic Surgery | Admitting: Orthopaedic Surgery

## 2021-03-27 ENCOUNTER — Ambulatory Visit (HOSPITAL_COMMUNITY): Payer: BC Managed Care – PPO | Admitting: Anesthesiology

## 2021-03-27 ENCOUNTER — Other Ambulatory Visit: Payer: Self-pay

## 2021-03-27 DIAGNOSIS — I10 Essential (primary) hypertension: Secondary | ICD-10-CM | POA: Insufficient documentation

## 2021-03-27 DIAGNOSIS — E785 Hyperlipidemia, unspecified: Secondary | ICD-10-CM | POA: Diagnosis not present

## 2021-03-27 DIAGNOSIS — Z87891 Personal history of nicotine dependence: Secondary | ICD-10-CM | POA: Insufficient documentation

## 2021-03-27 DIAGNOSIS — Z471 Aftercare following joint replacement surgery: Secondary | ICD-10-CM | POA: Diagnosis not present

## 2021-03-27 DIAGNOSIS — Z96641 Presence of right artificial hip joint: Secondary | ICD-10-CM | POA: Diagnosis not present

## 2021-03-27 DIAGNOSIS — M1611 Unilateral primary osteoarthritis, right hip: Principal | ICD-10-CM | POA: Insufficient documentation

## 2021-03-27 DIAGNOSIS — Z96649 Presence of unspecified artificial hip joint: Secondary | ICD-10-CM

## 2021-03-27 DIAGNOSIS — R7303 Prediabetes: Secondary | ICD-10-CM | POA: Diagnosis not present

## 2021-03-27 DIAGNOSIS — Z96642 Presence of left artificial hip joint: Secondary | ICD-10-CM | POA: Diagnosis not present

## 2021-03-27 DIAGNOSIS — Z419 Encounter for procedure for purposes other than remedying health state, unspecified: Secondary | ICD-10-CM

## 2021-03-27 DIAGNOSIS — Z79899 Other long term (current) drug therapy: Secondary | ICD-10-CM | POA: Insufficient documentation

## 2021-03-27 DIAGNOSIS — Z20822 Contact with and (suspected) exposure to covid-19: Secondary | ICD-10-CM | POA: Insufficient documentation

## 2021-03-27 DIAGNOSIS — Z01818 Encounter for other preprocedural examination: Secondary | ICD-10-CM | POA: Diagnosis not present

## 2021-03-27 HISTORY — PX: TOTAL HIP ARTHROPLASTY: SHX124

## 2021-03-27 LAB — SARS CORONAVIRUS 2 BY RT PCR (HOSPITAL ORDER, PERFORMED IN ~~LOC~~ HOSPITAL LAB): SARS Coronavirus 2: NEGATIVE

## 2021-03-27 SURGERY — ARTHROPLASTY, HIP, TOTAL, ANTERIOR APPROACH
Anesthesia: Spinal | Site: Hip | Laterality: Right

## 2021-03-27 MED ORDER — SODIUM CHLORIDE 0.9 % IR SOLN
Status: DC | PRN
  Administered 2021-03-27: 1000 mL

## 2021-03-27 MED ORDER — LACTATED RINGERS IV SOLN: INTRAVENOUS | Status: DC

## 2021-03-27 MED ORDER — ACETAMINOPHEN 500 MG PO TABS: 1000.0000 mg | ORAL_TABLET | Freq: Once | ORAL | Status: AC

## 2021-03-27 MED ORDER — POVIDONE-IODINE 10 % EX SWAB
2.0000 "application " | Freq: Once | CUTANEOUS | Status: AC
  Administered 2021-03-27: 2 via TOPICAL

## 2021-03-27 MED ORDER — LACTATED RINGERS IV SOLN: INTRAVENOUS | Status: DC | PRN

## 2021-03-27 MED ORDER — CEFAZOLIN SODIUM-DEXTROSE 2-4 GM/100ML-% IV SOLN
INTRAVENOUS | Status: AC
  Filled 2021-03-27: qty 100

## 2021-03-27 MED ORDER — EPHEDRINE SULFATE 50 MG/ML IJ SOLN
INTRAMUSCULAR | Status: DC | PRN
  Administered 2021-03-27: 5 mg via INTRAVENOUS

## 2021-03-27 MED ORDER — BUPIVACAINE-MELOXICAM ER 400-12 MG/14ML IJ SOLN
INTRAMUSCULAR | Status: AC
  Filled 2021-03-27: qty 1

## 2021-03-27 MED ORDER — SCOPOLAMINE 1 MG/3DAYS TD PT72: 1.0000 | MEDICATED_PATCH | TRANSDERMAL | Status: DC

## 2021-03-27 MED ORDER — CHLORHEXIDINE GLUCONATE 0.12 % MT SOLN
OROMUCOSAL | Status: AC
  Administered 2021-03-27: 15 mL via OROMUCOSAL
  Filled 2021-03-27: qty 15

## 2021-03-27 MED ORDER — MIDAZOLAM HCL 5 MG/5ML IJ SOLN
INTRAMUSCULAR | Status: DC | PRN
  Administered 2021-03-27: 2 mg via INTRAVENOUS

## 2021-03-27 MED ORDER — VANCOMYCIN HCL 1000 MG IV SOLR
INTRAVENOUS | Status: AC
  Filled 2021-03-27: qty 20

## 2021-03-27 MED ORDER — TRANEXAMIC ACID-NACL 1000-0.7 MG/100ML-% IV SOLN
1000.0000 mg | INTRAVENOUS | Status: AC
  Administered 2021-03-27: 1000 mg via INTRAVENOUS

## 2021-03-27 MED ORDER — ORAL CARE MOUTH RINSE: 15.0000 mL | Freq: Once | OROMUCOSAL | Status: AC

## 2021-03-27 MED ORDER — BUPIVACAINE IN DEXTROSE 0.75-8.25 % IT SOLN
INTRATHECAL | Status: DC | PRN
  Administered 2021-03-27: 15 mL via INTRATHECAL

## 2021-03-27 MED ORDER — ALBUMIN HUMAN 5 % IV SOLN
INTRAVENOUS | Status: DC | PRN
Start: 2021-03-27 — End: 2021-03-27

## 2021-03-27 MED ORDER — CELECOXIB 200 MG PO CAPS
ORAL_CAPSULE | ORAL | Status: AC
  Administered 2021-03-27: 200 mg via ORAL
  Filled 2021-03-27: qty 1

## 2021-03-27 MED ORDER — PHENYLEPHRINE 40 MCG/ML (10ML) SYRINGE FOR IV PUSH (FOR BLOOD PRESSURE SUPPORT)
PREFILLED_SYRINGE | INTRAVENOUS | Status: DC | PRN
  Administered 2021-03-27: 40 ug via INTRAVENOUS
  Administered 2021-03-27 (×3): 80 ug via INTRAVENOUS

## 2021-03-27 MED ORDER — ONDANSETRON HCL 4 MG/2ML IJ SOLN
INTRAMUSCULAR | Status: DC | PRN
  Administered 2021-03-27: 4 mg via INTRAVENOUS

## 2021-03-27 MED ORDER — CEFAZOLIN SODIUM-DEXTROSE 2-4 GM/100ML-% IV SOLN
2.0000 g | INTRAVENOUS | Status: AC
  Administered 2021-03-27: 2 g via INTRAVENOUS

## 2021-03-27 MED ORDER — AMISULPRIDE (ANTIEMETIC) 5 MG/2ML IV SOLN: 10.0000 mg | Freq: Once | INTRAVENOUS | Status: DC | PRN

## 2021-03-27 MED ORDER — VANCOMYCIN HCL 1 G IV SOLR
INTRAVENOUS | Status: DC | PRN
  Administered 2021-03-27: 1000 mg via TOPICAL

## 2021-03-27 MED ORDER — CHLORHEXIDINE GLUCONATE 0.12 % MT SOLN: 15.0000 mL | Freq: Once | OROMUCOSAL | Status: AC

## 2021-03-27 MED ORDER — PROPOFOL 500 MG/50ML IV EMUL
INTRAVENOUS | Status: DC | PRN
  Administered 2021-03-27: 100 ug/kg/min via INTRAVENOUS

## 2021-03-27 MED ORDER — 0.9 % SODIUM CHLORIDE (POUR BTL) OPTIME
TOPICAL | Status: DC | PRN
  Administered 2021-03-27: 1000 mL

## 2021-03-27 MED ORDER — TRANEXAMIC ACID-NACL 1000-0.7 MG/100ML-% IV SOLN
INTRAVENOUS | Status: AC
  Filled 2021-03-27: qty 100

## 2021-03-27 MED ORDER — PROPOFOL 1000 MG/100ML IV EMUL
INTRAVENOUS | Status: AC
  Filled 2021-03-27: qty 100

## 2021-03-27 MED ORDER — CELECOXIB 200 MG PO CAPS: 200.0000 mg | ORAL_CAPSULE | Freq: Once | ORAL | Status: AC

## 2021-03-27 MED ORDER — MIDAZOLAM HCL 2 MG/2ML IJ SOLN
INTRAMUSCULAR | Status: AC
  Filled 2021-03-27: qty 2

## 2021-03-27 MED ORDER — SCOPOLAMINE 1 MG/3DAYS TD PT72
MEDICATED_PATCH | TRANSDERMAL | Status: AC
  Administered 2021-03-27: 1.5 mg via TRANSDERMAL
  Filled 2021-03-27: qty 1

## 2021-03-27 MED ORDER — PROPOFOL 10 MG/ML IV BOLUS
INTRAVENOUS | Status: AC
  Filled 2021-03-27: qty 20

## 2021-03-27 MED ORDER — DEXAMETHASONE SODIUM PHOSPHATE 10 MG/ML IJ SOLN
INTRAMUSCULAR | Status: DC | PRN
  Administered 2021-03-27: 5 mg via INTRAVENOUS

## 2021-03-27 MED ORDER — PROMETHAZINE HCL 25 MG/ML IJ SOLN: 6.2500 mg | INTRAMUSCULAR | Status: DC | PRN

## 2021-03-27 MED ORDER — FENTANYL CITRATE (PF) 100 MCG/2ML IJ SOLN: 25.0000 ug | INTRAMUSCULAR | Status: DC | PRN

## 2021-03-27 MED ORDER — ACETAMINOPHEN 500 MG PO TABS
ORAL_TABLET | ORAL | Status: AC
  Administered 2021-03-27: 1000 mg via ORAL
  Filled 2021-03-27: qty 2

## 2021-03-27 MED ORDER — PHENYLEPHRINE HCL-NACL 20-0.9 MG/250ML-% IV SOLN
INTRAVENOUS | Status: DC | PRN
Start: 2021-03-27 — End: 2021-03-27
  Administered 2021-03-27: 30 ug/min via INTRAVENOUS

## 2021-03-27 MED ORDER — TRANEXAMIC ACID 1000 MG/10ML IV SOLN
INTRAVENOUS | Status: DC | PRN
  Administered 2021-03-27: 2000 mg via TOPICAL

## 2021-03-27 MED ORDER — BUPIVACAINE-MELOXICAM ER 400-12 MG/14ML IJ SOLN
INTRAMUSCULAR | Status: DC | PRN
  Administered 2021-03-27: 12 mL

## 2021-03-27 SURGICAL SUPPLY — 69 items
ADH SKN CLS APL DERMABOND .7 (GAUZE/BANDAGES/DRESSINGS) ×1
BAG COUNTER SPONGE SURGICOUNT (BAG) ×2 IMPLANT
BAG DECANTER FOR FLEXI CONT (MISCELLANEOUS) ×2 IMPLANT
BAG SPNG CNTER NS LX DISP (BAG) ×1
CELLS DAT CNTRL 66122 CELL SVR (MISCELLANEOUS) IMPLANT
COVER PERINEAL POST (MISCELLANEOUS) ×2 IMPLANT
COVER SURGICAL LIGHT HANDLE (MISCELLANEOUS) ×2 IMPLANT
CUP ACET PNNCL SECTR W/GRIP 56 (Hips) IMPLANT
DERMABOND ADVANCED (GAUZE/BANDAGES/DRESSINGS) ×1
DERMABOND ADVANCED .7 DNX12 (GAUZE/BANDAGES/DRESSINGS) IMPLANT
DRAPE C-ARM 42X72 X-RAY (DRAPES) ×2 IMPLANT
DRAPE POUCH INSTRU U-SHP 10X18 (DRAPES) ×2 IMPLANT
DRAPE STERI IOBAN 125X83 (DRAPES) ×2 IMPLANT
DRAPE U-SHAPE 47X51 STRL (DRAPES) ×4 IMPLANT
DRESSING AQUACEL AG SP 3.5X10 (GAUZE/BANDAGES/DRESSINGS) IMPLANT
DRSG AQUACEL AG ADV 3.5X10 (GAUZE/BANDAGES/DRESSINGS) ×2 IMPLANT
DRSG AQUACEL AG SP 3.5X10 (GAUZE/BANDAGES/DRESSINGS) ×2
DURAPREP 26ML APPLICATOR (WOUND CARE) ×4 IMPLANT
ELECT BLADE 4.0 EZ CLEAN MEGAD (MISCELLANEOUS) ×2
ELECT REM PT RETURN 9FT ADLT (ELECTROSURGICAL) ×2
ELECTRODE BLDE 4.0 EZ CLN MEGD (MISCELLANEOUS) ×1 IMPLANT
ELECTRODE REM PT RTRN 9FT ADLT (ELECTROSURGICAL) ×1 IMPLANT
GLOVE SURG LTX SZ7 (GLOVE) ×4 IMPLANT
GLOVE SURG SYN 7.5  E (GLOVE) ×8
GLOVE SURG SYN 7.5 E (GLOVE) ×4 IMPLANT
GLOVE SURG SYN 7.5 PF PI (GLOVE) ×4 IMPLANT
GLOVE SURG UNDER POLY LF SZ7 (GLOVE) ×40 IMPLANT
GLOVE SURG UNDER POLY LF SZ7.5 (GLOVE) ×4 IMPLANT
GOWN STRL REIN XL XLG (GOWN DISPOSABLE) ×2 IMPLANT
GOWN STRL REUS W/ TWL LRG LVL3 (GOWN DISPOSABLE) IMPLANT
GOWN STRL REUS W/ TWL XL LVL3 (GOWN DISPOSABLE) ×1 IMPLANT
GOWN STRL REUS W/TWL LRG LVL3 (GOWN DISPOSABLE)
GOWN STRL REUS W/TWL XL LVL3 (GOWN DISPOSABLE) ×2
HANDPIECE INTERPULSE COAX TIP (DISPOSABLE) ×2
HEAD CERAMIC 36 PLUS5 (Hips) ×1 IMPLANT
HOOD PEEL AWAY FLYTE STAYCOOL (MISCELLANEOUS) ×4 IMPLANT
IV NS 1000ML (IV SOLUTION) ×2
IV NS 1000ML BAXH (IV SOLUTION) IMPLANT
JET LAVAGE IRRISEPT WOUND (IRRIGATION / IRRIGATOR) ×2
KIT BASIN OR (CUSTOM PROCEDURE TRAY) ×2 IMPLANT
LAVAGE JET IRRISEPT WOUND (IRRIGATION / IRRIGATOR) ×1 IMPLANT
MARKER SKIN DUAL TIP RULER LAB (MISCELLANEOUS) ×2 IMPLANT
NDL SPNL 18GX3.5 QUINCKE PK (NEEDLE) ×1 IMPLANT
NEEDLE SPNL 18GX3.5 QUINCKE PK (NEEDLE) ×2 IMPLANT
PACK TOTAL JOINT (CUSTOM PROCEDURE TRAY) ×2 IMPLANT
PACK UNIVERSAL I (CUSTOM PROCEDURE TRAY) ×2 IMPLANT
PINN SECTOR W/GRIP ACE CUP 56 (Hips) ×2 IMPLANT
PINNACLE ALTRX PLUS 4 N 36X56 (Hips) ×1 IMPLANT
RETRACTOR WND ALEXIS 18 MED (MISCELLANEOUS) IMPLANT
RTRCTR WOUND ALEXIS 18CM MED (MISCELLANEOUS)
SAW OSC TIP CART 19.5X105X1.3 (SAW) ×2 IMPLANT
SCREW 6.5MMX25MM (Screw) ×1 IMPLANT
SET HNDPC FAN SPRY TIP SCT (DISPOSABLE) ×1 IMPLANT
STAPLER VISISTAT 35W (STAPLE) IMPLANT
STEM FEM ACTIS HIGH SZ8 (Stem) ×1 IMPLANT
SUT ETHIBOND 2 V 37 (SUTURE) ×2 IMPLANT
SUT VIC AB 0 CT1 27 (SUTURE) ×2
SUT VIC AB 0 CT1 27XBRD ANBCTR (SUTURE) ×1 IMPLANT
SUT VIC AB 1 CTX 36 (SUTURE) ×2
SUT VIC AB 1 CTX36XBRD ANBCTR (SUTURE) ×1 IMPLANT
SUT VIC AB 2-0 CT1 27 (SUTURE) ×4
SUT VIC AB 2-0 CT1 TAPERPNT 27 (SUTURE) ×2 IMPLANT
SYR 50ML LL SCALE MARK (SYRINGE) ×2 IMPLANT
TOWEL GREEN STERILE (TOWEL DISPOSABLE) ×2 IMPLANT
TRAY CATH 16FR W/PLASTIC CATH (SET/KITS/TRAYS/PACK) IMPLANT
TRAY CATH INTERMITTENT SS 16FR (CATHETERS) ×1 IMPLANT
TRAY FOLEY W/BAG SLVR 16FR (SET/KITS/TRAYS/PACK) ×2
TRAY FOLEY W/BAG SLVR 16FR ST (SET/KITS/TRAYS/PACK) ×1 IMPLANT
YANKAUER SUCT BULB TIP NO VENT (SUCTIONS) ×2 IMPLANT

## 2021-03-27 NOTE — Evaluation (Signed)
Physical Therapy Evaluation Patient Details Name: Gabriel Oconnell MRN: [id number] DOB: 1965/06/13 Today's Date: 03/27/2021  History of Present Illness  The pt is a 55 yo male presenting 10/28 for elective R THA (anterior approach). PMH includes: acoustic neuroma, HLD, HTN, and L THA.   Clinical Impression  Pt in bed upon arrival of PT, agreeable to evaluation at this time. Prior to admission the pt was completely independent with no need for AD or assist with any IADLs, working full time outside of the home. The pt now presents with limitations in functional mobility, strength, dynamic stability, and activity tolerance due to above dx, and will continue to benefit from skilled PT to address these deficits. The pt was eager to mobilize at this time, benefits from cues to slow movements to improve safety as a result of his impulsivity and restlessness. He was able to complete sit-stand transfers with minG and use of RW to steady and was able to complete short bout of ambulation with minA to steady. Pt educated on fall risk reduction, need for assistance with all mobility at this time, especially given decreased sensation in BLE at this time. Suspect pt will make good progress with continued mobility and is safe to return home with family assist when medically cleared. All education regarding HEP for THA as well as precautions verbally discussed and given to pt as handout.         Recommendations for follow up therapy are one component of a multi-disciplinary discharge planning process, led by the attending physician.  Recommendations may be updated based on patient status, additional functional criteria and insurance authorization.  Follow Up Recommendations Follow physician's recommendations for discharge plan and follow up therapies    Assistance Recommended at Discharge Intermittent Supervision/Assistance  Functional Status Assessment Patient has had a recent decline in their functional status and  demonstrates the ability to make significant improvements in function in a reasonable and predictable amount of time.  Equipment Recommendations  None recommended by PT (pt has needed DME)    Recommendations for Other Services       Precautions / Restrictions Precautions Precautions: Fall;Anterior Hip Precaution Booklet Issued: Yes (comment) Restrictions Weight Bearing Restrictions: Yes RLE Weight Bearing: Weight bearing as tolerated      Mobility  Bed Mobility Overal bed mobility: Independent             General bed mobility comments: pt moving quickly and without assist    Transfers Overall transfer level: Needs assistance Equipment used: Rolling walker (2 wheels) Transfers: Sit to/from Stand Sit to Stand: Supervision           General transfer comment: minG initially, progressed to supervision. cues to slow movements and wait. pt impuslively standing with RW. incontinent of urine on each stand    Ambulation/Gait Ambulation/Gait assistance: Min assist Gait Distance (Feet): 45 Feet Assistive device: Rolling walker (2 wheels) Gait Pattern/deviations: Step-to pattern;Decreased stance time - right;Decreased stride length;Drifts right/left Gait velocity: decreased Gait velocity interpretation: <1.31 ft/sec, indicative of household ambulator General Gait Details: pt with inconsistent strides, internal rotation of RLE, and intermittent minA to steady with RW. improved with continued mobility      Balance Overall balance assessment: Needs assistance Sitting-balance support: No upper extremity supported Sitting balance-Leahy Scale: Good     Standing balance support: Bilateral upper extremity supported;Reliant on assistive device for balance Standing balance-Leahy Scale: Fair Standing balance comment: reliant on BUE support for gait and minA  Pertinent Vitals/Pain Pain Assessment: No/denies pain    Home Living  Family/patient expects to be discharged to:: Private residence Living Arrangements: Spouse/significant other;Children Available Help at Discharge: Family;Available 24 hours/day Type of Home: House Home Access: Stairs to enter Entrance Stairs-Rails: Right Entrance Stairs-Number of Steps: 3   Home Layout: Two level;Able to live on main level with bedroom/bathroom Home Equipment: Rolling Walker (2 wheels);Shower seat - built in      Prior Function Prior Level of Function : Independent/Modified Independent;Working/employed (full time)             Mobility Comments: indpendent, returned to full independence after last hip surgery       Hand Dominance   Dominant Hand: Right    Extremity/Trunk Assessment   Upper Extremity Assessment Upper Extremity Assessment: Overall WFL for tasks assessed    Lower Extremity Assessment Lower Extremity Assessment: RLE deficits/detail RLE Deficits / Details: numbness on foot and distal leg, improved sensation in thigh but decreased in buttocks and groin. grossly 4/5 RLE Sensation: decreased light touch    Cervical / Trunk Assessment Cervical / Trunk Assessment: Normal  Communication   Communication: No difficulties  Cognition Arousal/Alertness: Awake/alert Behavior During Therapy: Restless;Impulsive Overall Cognitive Status: Within Functional Limits for tasks assessed                                 General Comments: pt needing frequent cues to redirect and slow movements. impusive with all mobility but abel to answer all questions appropriately        General Comments General comments (skin integrity, edema, etc.): VSS, pt incontinent of urine with each standing trial    Exercises     Assessment/Plan    PT Assessment All further PT needs can be met in the next venue of care  PT Problem List Decreased strength;Decreased range of motion;Decreased activity tolerance;Decreased balance;Decreased mobility;Decreased  coordination;Decreased cognition;Pain       PT Treatment Interventions      PT Goals (Current goals can be found in the Care Plan section)  Acute Rehab PT Goals Patient Stated Goal: return home PT Goal Formulation: With patient Time For Goal Achievement: 04/10/21 Potential to Achieve Goals: Good     AM-PAC PT "6 Clicks" Mobility  Outcome Measure Help needed turning from your back to your side while in a flat bed without using bedrails?: None Help needed moving from lying on your back to sitting on the side of a flat bed without using bedrails?: None Help needed moving to and from a bed to a chair (including a wheelchair)?: A Little Help needed standing up from a chair using your arms (e.g., wheelchair or bedside chair)?: A Little Help needed to walk in hospital room?: A Little Help needed climbing 3-5 steps with a railing? : A Little 6 Click Score: 20    End of Session Equipment Utilized During Treatment: Gait belt Activity Tolerance: Patient tolerated treatment well Patient left: in bed;with nursing/sitter in room Nurse Communication: Mobility status PT Visit Diagnosis: Unsteadiness on feet (R26.81);Other abnormalities of gait and mobility (R26.89);Muscle weakness (generalized) (M62.81)    Time: 2549-8264 PT Time Calculation (min) (ACUTE ONLY): 25 min   Charges:   PT Evaluation $PT Eval Low Complexity: 1 Low PT Treatments $Gait Training: 8-22 mins        West Carbo, PT, DPT   Acute Rehabilitation Department Pager #: [phone]  Sandra Cockayne 03/27/2021, 5:24  PM

## 2021-03-27 NOTE — Op Note (Signed)
RIGHT TOTAL HIP ARTHROPLASTY ANTERIOR APPROACH  Procedure Note Hardy Harcum   [id number]  Pre-op Diagnosis: right hip degenerative joint disease     Post-op Diagnosis: same   Operative Procedures  1. Total hip replacement; Right hip; uncemented [id number]   Surgeon: Frankey Shown, M.D.  Assist: Madalyn Rob, PA-C   Anesthesia: spinal, local  Prosthesis: Depuy Acetabulum: Pinnacle 56 mm Femur: Actis 8 HO Head: 36 mm size: +5 Liner: +4  Bearing Type: ceramic/poly  Total Hip Arthroplasty (Anterior Approach) Op Note:  After informed consent was obtained and the operative extremity marked in the holding area, the patient was brought back to the operating room and placed supine on the HANA table. Next, the operative extremity was prepped and draped in normal sterile fashion. Surgical timeout occurred verifying patient identification, surgical site, surgical procedure and administration of antibiotics.  A modified anterior Smith-Peterson approach to the hip was performed, using the interval between tensor fascia lata and sartorius.  Dissection was carried bluntly down onto the anterior hip capsule. The lateral femoral circumflex vessels were identified and coagulated. A capsulotomy was performed and the capsular flaps tagged for later repair.  The neck osteotomy was performed. The femoral head was removed which showed severe wear, the acetabular rim was cleared of soft tissue and attention was turned to reaming the acetabulum.  Sequential reaming was performed under fluoroscopic guidance. We reamed to a size 55 mm, and then impacted the acetabular shell. A 25 mm cancellous screw was placed through the shell for added fixation.  The liner was then placed after irrigation and attention turned to the femur.  After placing the femoral hook, the leg was taken to externally rotated, extended and adducted position taking care to perform soft tissue releases to allow for adequate mobilization of  the femur. Soft tissue was cleared from the shoulder of the greater trochanter and the hook elevator used to improve exposure of the proximal femur. Sequential broaching performed up to a size 8. Trial neck and head were placed. The leg was brought back up to neutral and the construct reduced.  Antibiotic irrigation was placed in the surgical wound and kept for at least 1 minute.  The position and sizing of components, offset and leg lengths were checked using fluoroscopy. Stability of the construct was checked in extension and external rotation without any subluxation or impingement of prosthesis. We dislocated the prosthesis, dropped the leg back into position, removed trial components, and irrigated copiously. The final stem and head was then placed, the leg brought back up, the system reduced and fluoroscopy used to verify positioning.  We irrigated, obtained hemostasis and closed the capsule using #2 ethibond suture.  One gram of vancomycin powder was placed in the surgical bed.   One gram of topical tranexamic acid was injected into the joint.  The fascia was closed with #1 vicryl plus, the deep fat layer was closed with 0 vicryl, the subcutaneous layers closed with 2.0 Vicryl Plus and the skin closed with 2.0 nylon and dermabond. A sterile dressing was applied. The patient was awakened in the operating room and taken to recovery in stable condition.  All sponge, needle, and instrument counts were correct at the end of the case.   Tawanna Cooler, my PA, was a medical necessity for opening, closing, limb positioning, retracting, exposing, and overall facilitation and timely completion of the surgery.  Position: supine  Complications: see description of procedure.  Time Out: performed   Drains/Packing: none  Estimated  blood loss: see anesthesia record  Returned to Recovery Room: in good condition.   Antibiotics: yes   Mechanical VTE (DVT) Prophylaxis: sequential compression devices, TED  thigh-high  Chemical VTE (DVT) Prophylaxis: aspirin   Fluid Replacement: see anesthesia record  Specimens Removed: 1 to pathology   Sponge and Instrument Count Correct? yes   PACU: portable radiograph - low AP   Plan/RTC: Return in 2 weeks for staple removal. Weight Bearing/Load Lower Extremity: full  Hip precautions: none Suture Removal: 2 weeks   N. Eduard Roux, MD Marga Hoots 3:04 PM   Implant Name Type Inv. Item Serial No. Manufacturer Lot No. LRB No. Used Action  PINN SECTOR W/GRIP ACE CUP 56 - [id number] Hips PINN SECTOR W/GRIP ACE CUP 56  [employer] [id number] Right 1 Implanted  PINNACLE ALTRX PLUS 4 N 36X56 - [id number] Hips PINNACLE ALTRX PLUS 4 N 36X56  [employer] [id number] Right 1 Implanted  SCREW 6.5MMX25MM - [id number] Screw SCREW 6.5MMX25MM  [employer] [id number] Right 1 Implanted  HEAD CERAMIC 36 PLUS5 - [id number] Hips HEAD CERAMIC 36 PLUS5  [employer] [id number] Right 1 Implanted

## 2021-03-27 NOTE — H&P (Signed)
PREOPERATIVE H&P  Chief Complaint: right hip degenerative joint disease  HPI: Gabriel Oconnell is a 55 y.o. male who presents for surgical treatment of right hip degenerative joint disease.  He denies any changes in medical history.  Past Medical History:  Diagnosis Date   Acoustic neuroma ([place])    Hearing loss    right ear -no hearing aids   Hyperlipidemia    Hypertension    PONV (postoperative nausea and vomiting)    Dry Heaves after surgery 2022   Past Surgical History:  Procedure Laterality Date   COLONOSCOPY     KNEE SURGERY     left   TOTAL HIP ARTHROPLASTY Left 06/13/2020   Procedure: LEFT TOTAL HIP ARTHROPLASTY ANTERIOR APPROACH;  Surgeon: Leandrew Koyanagi, MD;  Location: [facility];  Service: Orthopedics;  Laterality: Left;  3C   WISDOM TOOTH EXTRACTION     Social History   Socioeconomic History   Marital status: Married    Spouse name: Not on file   Number of children: Not on file   Years of education: Not on file   Highest education level: Not on file  Occupational History   Not on file  Tobacco Use   Smoking status: Former    Types: Cigarettes    Quit date: 10/13/1995    Years since quitting: 25.4   Smokeless tobacco: Never   Tobacco comments:    social smoker - quit 1997  Vaping Use   Vaping Use: Never used  Substance and Sexual Activity   Alcohol use: Yes    Alcohol/week: 2.0 standard drinks    Types: 2 Standard drinks or equivalent per week    Comment: 2-4 drinks per week per pt   Drug use: No   Sexual activity: Not on file  Other Topics Concern   Not on file  Social History Narrative   Not on file   Social Determinants of Health   Financial Resource Strain: Not on file  Food Insecurity: Not on file  Transportation Needs: Not on file  Physical Activity: Not on file  Stress: Not on file  Social Connections: Not on file   Family History  Problem Relation Age of Onset   Pancreatic cancer Father    Colon polyps Father    Colon cancer Father         late 67's   Colon cancer Paternal Grandmother        early 76's   Heart disease Mother    No Known Allergies Prior to Admission medications   Medication Sig Start Date End Date Taking? Authorizing Provider  acetaminophen (TYLENOL) 500 MG tablet Take 1,000 mg by mouth every 4 (four) hours as needed (pain).   Yes [provider]  amLODipine (NORVASC) 10 MG tablet Take 1 tablet (10 mg total) by mouth daily. 01/26/21  Yes Copland, Gay Filler, MD  aspirin EC 81 MG tablet Take 1 tablet (81 mg total) by mouth 2 (two) times daily. To be taken after surgery to prevent blood clots 03/18/21   Aundra Dubin, PA-C  cetirizine (ZYRTEC) 10 MG tablet Take 10 mg by mouth in the morning.   Yes [provider]  docusate sodium (COLACE) 100 MG capsule Take 1 capsule (100 mg total) by mouth daily as needed. 03/18/21 03/18/22  Aundra Dubin, PA-C  Glucosamine HCl 1500 MG TABS Take 1,500 mg by mouth in the morning.   Yes [provider]  methocarbamol (ROBAXIN) 500 MG tablet Take 1 tablet (  500 mg total) by mouth 2 (two) times daily as needed. To be taken after surgery 03/18/21   Aundra Dubin, PA-C  Multiple Vitamin (MULTIVITAMIN WITH MINERALS) TABS tablet Take 1 tablet by mouth in the morning.   Yes [provider]  ondansetron (ZOFRAN) 4 MG tablet Take 1 tablet (4 mg total) by mouth every 8 (eight) hours as needed for nausea or vomiting. 03/18/21   Aundra Dubin, PA-C  oxyCODONE-acetaminophen (PERCOCET) 5-325 MG tablet Take 1 tablet by mouth every 4 (four) hours as needed. To be taken after surgery 03/18/21   Aundra Dubin, PA-C  simvastatin (ZOCOR) 20 MG tablet Take 1 tablet (20 mg total) by mouth daily. TAKE 1 TABLET DAILY 01/26/21  Yes Copland, Gay Filler, MD  valsartan-hydrochlorothiazide (DIOVAN-HCT) 320-12.5 MG tablet Take 1 tablet by mouth daily. TAKE 1 TABLET DAILY 01/26/21  Yes Copland, Gay Filler, MD  amoxicillin (AMOXIL) 500 MG capsule Take 4 capsules  (2,000 mg total) by mouth See admin instructions. Take 4 capsules (2000 mg) by mouth 1 hour prior to dental appointment 03/16/21   Leandrew Koyanagi, MD     Positive ROS: All other systems have been reviewed and were otherwise negative with the exception of those mentioned in the HPI and as above.  Physical Exam: General: Alert, no acute distress Cardiovascular: No pedal edema Respiratory: No cyanosis, no use of accessory musculature GI: abdomen soft Skin: No lesions in the area of chief complaint Neurologic: Sensation intact distally Psychiatric: Patient is competent for consent with normal mood and affect Lymphatic: no lymphedema  MUSCULOSKELETAL: exam stable  Assessment: right hip degenerative joint disease  Plan: Plan for Procedure(s): RIGHT TOTAL HIP ARTHROPLASTY ANTERIOR APPROACH  The risks benefits and alternatives were discussed with the patient including but not limited to the risks of nonoperative treatment, versus surgical intervention including infection, bleeding, nerve injury,  blood clots, cardiopulmonary complications, morbidity, mortality, among others, and they were willing to proceed.   Preoperative templating of the joint replacement has been completed, documented, and submitted to the Operating Room personnel in order to optimize intra-operative equipment management.   Eduard Roux, MD 03/27/2021 10:18 AM

## 2021-03-27 NOTE — Transfer of Care (Signed)
Immediate Anesthesia Transfer of Care Note  Patient: Arjuna Doeden  Procedure(s) Performed: RIGHT TOTAL HIP ARTHROPLASTY ANTERIOR APPROACH (Right: Hip)  Patient Location: PACU  Anesthesia Type:General  Level of Consciousness: awake and oriented  Airway & Oxygen Therapy: Patient Spontanous Breathing and Patient connected to face mask oxygen  Post-op Assessment: Report given to RN and Post -op Vital signs reviewed and stable  Post vital signs: stable  Last Vitals:  Vitals Value Taken Time  BP 110/77 03/27/21 1531  Temp    Pulse 74 03/27/21 1533  Resp 15 03/27/21 1533  SpO2 99 % 03/27/21 1533  Vitals shown include unvalidated device data.  Last Pain:  Vitals:   03/27/21 1033  TempSrc:   PainSc: 3       Patients Stated Pain Goal: 2 (54/62/70 3500)  Complications: No notable events documented.

## 2021-03-27 NOTE — Discharge Instructions (Signed)

## 2021-03-27 NOTE — Anesthesia Procedure Notes (Signed)
Spinal  Patient location during procedure: OR Start time: 03/27/2021 1:28 PM End time: 03/27/2021 1:36 PM Reason for block: surgical anesthesia Staffing Performed: anesthesiologist  Anesthesiologist: Duane Boston, MD Preanesthetic Checklist Completed: patient identified, IV checked, risks and benefits discussed, surgical consent, monitors and equipment checked, pre-op evaluation and timeout performed Spinal Block Patient position: sitting Prep: DuraPrep Patient monitoring: cardiac monitor, continuous pulse ox and blood pressure Approach: midline Location: L2-3 Injection technique: single-shot Needle Needle type: Pencan  Needle gauge: 24 G Needle length: 9 cm Assessment Events: CSF return Additional Notes Functioning IV was confirmed and monitors were applied. Sterile prep and drape, including hand hygiene and sterile gloves were used. The patient was positioned and the spine was prepped. The skin was anesthetized with lidocaine.  Free flow of clear CSF was obtained prior to injecting local anesthetic into the CSF.  The spinal needle aspirated freely following injection.  The needle was carefully withdrawn.  The patient tolerated the procedure well.

## 2021-03-27 NOTE — Anesthesia Preprocedure Evaluation (Addendum)
Anesthesia Evaluation  Patient identified by MRN, date of birth, ID band Patient awake    Reviewed: Allergy & Precautions, NPO status , Patient's Chart, lab work & pertinent test results  History of Anesthesia Complications (+) PONV and history of anesthetic complications  Airway Mallampati: II  TM Distance: >3 FB Neck ROM: Full    Dental no notable dental hx. (+) Dental Advisory Given   Pulmonary neg pulmonary ROS, former smoker,    Pulmonary exam normal        Cardiovascular hypertension, Pt. on medications Normal cardiovascular exam     Neuro/Psych Acoustic neuroma  Neuromuscular disease (acoustic neuroma) negative psych ROS   GI/Hepatic negative GI ROS, Neg liver ROS,   Endo/Other  negative endocrine ROS  Renal/GU negative Renal ROS  negative genitourinary   Musculoskeletal  (+) Arthritis , Osteoarthritis,    Abdominal   Peds  Hematology negative hematology ROS (+)   Anesthesia Other Findings   Reproductive/Obstetrics                            Anesthesia Physical  Anesthesia Plan  ASA: 2  Anesthesia Plan:    Post-op Pain Management:    Induction: Intravenous  PONV Risk Score and Plan: 1 and Ondansetron, Dexamethasone, Propofol infusion and Treatment may vary due to age or medical condition  Airway Management Planned:   Additional Equipment: None  Intra-op Plan:   Post-operative Plan:   Informed Consent: I have reviewed the patients History and Physical, chart, labs and discussed the procedure including the risks, benefits and alternatives for the proposed anesthesia with the patient or authorized representative who has indicated his/her understanding and acceptance.     Dental advisory given  Plan Discussed with: Anesthesiologist, CRNA and Surgeon  Anesthesia Plan Comments: (   )       Anesthesia Quick Evaluation

## 2021-03-28 NOTE — Anesthesia Postprocedure Evaluation (Signed)
Anesthesia Post Note  Patient: Gabriel Oconnell  Procedure(s) Performed: RIGHT TOTAL HIP ARTHROPLASTY ANTERIOR APPROACH (Right: Hip)     Patient location during evaluation: PACU Anesthesia Type: Spinal and MAC Level of consciousness: awake and alert Pain management: pain level controlled Vital Signs Assessment: post-procedure vital signs reviewed and stable Respiratory status: spontaneous breathing and respiratory function stable Cardiovascular status: blood pressure returned to baseline and stable Postop Assessment: spinal receding Anesthetic complications: no   No notable events documented.  Last Vitals:  Vitals:   03/27/21 1630 03/27/21 1645  BP: (!) 142/93 134/76  Pulse: 68 (!) 58  Resp: 13 16  Temp:  36.7 C  SpO2: 97% 100%    Last Pain:  Vitals:   03/27/21 1645  TempSrc:   PainSc: 2                  Araiya Tilmon DANIEL

## 2021-03-31 ENCOUNTER — Encounter (HOSPITAL_COMMUNITY): Payer: Self-pay | Admitting: Orthopaedic Surgery

## 2021-04-01 NOTE — Discharge Summary (Signed)
Patient ID: Bralyn Folkert MRN: [id number] DOB/AGE: 1965-09-22 55 y.o.  Admit date: 03/27/2021 Discharge date: 04/01/2021  Admission Diagnoses:  Primary osteoarthritis of right hip  Discharge Diagnoses:  Principal Problem:   Primary osteoarthritis of right hip Active Problems:   Status post total replacement of right hip   Past Medical History:  Diagnosis Date   Acoustic neuroma ([place])    Hearing loss    right ear -no hearing aids   Hyperlipidemia    Hypertension    PONV (postoperative nausea and vomiting)    Dry Heaves after surgery 2022    Surgeries: Procedure(s): RIGHT TOTAL HIP ARTHROPLASTY ANTERIOR APPROACH on 03/27/2021   Consultants (if any):   Discharged Condition: Improved  Hospital Course: Kimsey Demaree is an 55 y.o. male who was admitted 03/27/2021 with a diagnosis of Primary osteoarthritis of right hip and went to the operating room on 03/27/2021 and underwent the above named procedures.    He was given perioperative antibiotics:  Anti-infectives (From admission, onward)    Start     Dose/Rate Route Frequency Ordered Stop   03/27/21 1410  vancomycin (VANCOCIN) powder  Status:  Discontinued          As needed 03/27/21 1410 03/27/21 1526   03/27/21 1009  ceFAZolin (ANCEF) 2-4 GM/100ML-% IVPB       Note to Pharmacy: Tamsen Snider   : cabinet override      03/27/21 1009 03/27/21 1350   03/27/21 1000  ceFAZolin (ANCEF) IVPB 2g/100 mL premix        2 g 200 mL/hr over 30 Minutes Intravenous On call to O.R. 03/27/21 [id number] 03/27/21 1358     .  He was given sequential compression devices, early ambulation, and appropriate chemoprophylaxis for DVT prophylaxis.  He benefited maximally from the hospital stay and there were no complications.    Recent vital signs:  Vitals:   03/27/21 1630 03/27/21 1645  BP: (!) 142/93 134/76  Pulse: 68 (!) 58  Resp: 13 16  Temp:  98 F (36.7 C)  SpO2: 97% 100%    Recent laboratory studies:  Lab Results   Component Value Date   HGB 14.5 03/18/2021   HGB 14.2 01/26/2021   HGB 12.9 (L) 06/14/2020   Lab Results  Component Value Date   WBC 9.4 03/18/2021   PLT 299 03/18/2021   Lab Results  Component Value Date   INR 1.0 03/18/2021   Lab Results  Component Value Date   NA 139 03/18/2021   K 3.4 (L) 03/18/2021   CL 103 03/18/2021   CO2 28 03/18/2021   BUN 9 03/18/2021   CREATININE 0.94 03/18/2021   GLUCOSE 107 (H) 03/18/2021    Discharge Medications:   Allergies as of 03/27/2021   No Known Allergies      Medication List     ASK your doctor about these medications    acetaminophen 500 MG tablet Commonly known as: TYLENOL Take 1,000 mg by mouth every 4 (four) hours as needed (pain).   amLODipine 10 MG tablet Commonly known as: NORVASC Take 1 tablet (10 mg total) by mouth daily.   amoxicillin 500 MG capsule Commonly known as: AMOXIL Take 4 capsules (2,000 mg total) by mouth See admin instructions. Take 4 capsules (2000 mg) by mouth 1 hour prior to dental appointment   aspirin EC 81 MG tablet Take 1 tablet (81 mg total) by mouth 2 (two) times daily. To be taken after surgery to prevent blood clots  cetirizine 10 MG tablet Commonly known as: ZYRTEC Take 10 mg by mouth in the morning.   docusate sodium 100 MG capsule Commonly known as: Colace Take 1 capsule (100 mg total) by mouth daily as needed.   Glucosamine HCl 1500 MG Tabs Take 1,500 mg by mouth in the morning.   methocarbamol 500 MG tablet Commonly known as: Robaxin Take 1 tablet (500 mg total) by mouth 2 (two) times daily as needed. To be taken after surgery   multivitamin with minerals Tabs tablet Take 1 tablet by mouth in the morning.   ondansetron 4 MG tablet Commonly known as: Zofran Take 1 tablet (4 mg total) by mouth every 8 (eight) hours as needed for nausea or vomiting.   oxyCODONE-acetaminophen 5-325 MG tablet Commonly known as: Percocet Take 1 tablet by mouth every 4 (four) hours as  needed. To be taken after surgery   simvastatin 20 MG tablet Commonly known as: ZOCOR Take 1 tablet (20 mg total) by mouth daily. TAKE 1 TABLET DAILY   valsartan-hydrochlorothiazide 320-12.5 MG tablet Commonly known as: DIOVAN-HCT Take 1 tablet by mouth daily. TAKE 1 TABLET DAILY        Diagnostic Studies: DG Pelvis Portable  Result Date: 03/27/2021 CLINICAL DATA:  Right hip replacement. EXAM: PORTABLE PELVIS 1-2 VIEWS COMPARISON:  Preoperative radiograph 12/30/2020 FINDINGS: Right hip arthroplasty in expected alignment. No periprosthetic lucency or fracture. Recent postsurgical change includes air and edema in the soft tissues. Previous left hip arthroplasty is intact were visualized IMPRESSION: Right hip arthroplasty without immediate postoperative complication. Electronically Signed   By: Keith Rake M.D.   On: 03/27/2021 15:54   DG C-Arm 1-60 Min-No Report  Result Date: 03/27/2021 Fluoroscopy was utilized by the requesting physician.  No radiographic interpretation.   DG C-Arm 1-60 Min-No Report  Result Date: 03/27/2021 Fluoroscopy was utilized by the requesting physician.  No radiographic interpretation.   DG HIP OPERATIVE UNILAT WITH PELVIS RIGHT  Result Date: 03/27/2021 CLINICAL DATA:  Right hip arthroplasty. EXAM: OPERATIVE RIGHT HIP (WITH PELVIS IF PERFORMED) TECHNIQUE: Fluoroscopic spot image(s) were submitted for interpretation post-operatively. COMPARISON:  Preoperative radiograph 12/30/2020 FINDINGS: Four fluoroscopic spot views of the pelvis and right hip obtained in the operating room. Interval right hip arthroplasty. Previous left hip arthroplasty is partially included. Fluoroscopy time 28 seconds. Dose 2.20 mGy. IMPRESSION: Procedural fluoroscopy for right hip arthroplasty Electronically Signed   By: Keith Rake M.D.   On: 03/27/2021 15:55    Disposition: Discharge disposition: 01-Home or Self Care            Signed: Eduard Roux 04/01/2021,  7:04 AM

## 2021-04-10 ENCOUNTER — Encounter: Payer: Self-pay | Admitting: Orthopaedic Surgery

## 2021-04-10 ENCOUNTER — Other Ambulatory Visit: Payer: Self-pay

## 2021-04-10 ENCOUNTER — Ambulatory Visit (INDEPENDENT_AMBULATORY_CARE_PROVIDER_SITE_OTHER): Payer: BC Managed Care – PPO | Admitting: Physician Assistant

## 2021-04-10 DIAGNOSIS — Z96641 Presence of right artificial hip joint: Secondary | ICD-10-CM

## 2021-04-10 NOTE — Progress Notes (Signed)
Post-Op Visit Note   Patient: Gabriel Oconnell           Date of Birth: Jul 13, 1965           MRN: [id number] Visit Date: 04/10/2021 PCP: Darreld Mclean, MD   Assessment & Plan:  Chief Complaint:  Chief Complaint  Patient presents with   Right Hip - Pain, Routine Post Op   Visit Diagnoses:  1. History of total hip replacement, right     Plan: Patient is a pleasant 55 year old gentleman who comes in today 2 weeks status post right total hip replacement 03/27/2021.  He has been doing great.  He has been working on a home exercise program.  He is ambulating without assistance.  He has no pain just an occasional discomfort which is not requiring medication.  He denies any calf pain, chest pain or shortness of breath.  Examination of the right hip reveals a well healed surgical incision with nylon sutures in place.  No evidence of infection or cellulitis.  Calves are soft nontender.  He is neurovascular intact distally.  Today, sutures removed and Steri-Strips applied.  He will continue to advance with activity as tolerated.  Dental prophylaxis reinforced.  He will follow-up with [place] in 4 weeks time for repeat evaluation of bilateral total hip replacements as well as AP pelvis x-rays.  Call with concerns or questions in the meantime.  Follow-Up Instructions: Return in about 4 weeks (around 05/08/2021).   Orders:  No orders of the defined types were placed in this encounter.  No orders of the defined types were placed in this encounter.   Imaging: No new imaging  PMFS History: Patient Active Problem List   Diagnosis Date Noted   Primary osteoarthritis of right hip 03/27/2021   Status post total replacement of right hip 03/27/2021   Status post total replacement of left hip 06/13/2020   Chronic left-sided low back pain with left-sided sciatica 02/05/2020   Prediabetes 01/24/2020   Obesity 07/03/2015   Right foot pain 04/30/2015   Left foot pain 06/27/2014   Vestibular schwannoma  ([place]) 12/06/2012   Ringing in right ear 10/12/2012   Hip pain 07/07/2012   Family hx of colon cancer 09/29/2010   DANDRUFF 08/07/2008   LIPOMA 03/25/2008   Hyperlipidemia 08/24/2006   Essential hypertension 08/24/2006   Past Medical History:  Diagnosis Date   Acoustic neuroma ([place])    Hearing loss    right ear -no hearing aids   Hyperlipidemia    Hypertension    PONV (postoperative nausea and vomiting)    Dry Heaves after surgery 2022    Family History  Problem Relation Age of Onset   Pancreatic cancer Father    Colon polyps Father    Colon cancer Father        late 31's   Colon cancer Paternal Grandmother        early 20's   Heart disease Mother     Past Surgical History:  Procedure Laterality Date   COLONOSCOPY     KNEE SURGERY     left   TOTAL HIP ARTHROPLASTY Left 06/13/2020   Procedure: LEFT TOTAL HIP ARTHROPLASTY ANTERIOR APPROACH;  Surgeon: Leandrew Koyanagi, MD;  Location: [facility];  Service: Orthopedics;  Laterality: Left;  3C   TOTAL HIP ARTHROPLASTY Right 03/27/2021   Procedure: RIGHT TOTAL HIP ARTHROPLASTY ANTERIOR APPROACH;  Surgeon: Leandrew Koyanagi, MD;  Location: [facility];  Service: Orthopedics;  Laterality: Right;  3-C   WISDOM  TOOTH EXTRACTION     Social History   Occupational History   Not on file  Tobacco Use   Smoking status: Former    Types: Cigarettes    Quit date: 10/13/1995    Years since quitting: 25.5   Smokeless tobacco: Never   Tobacco comments:    social smoker - quit 1997  Vaping Use   Vaping Use: Never used  Substance and Sexual Activity   Alcohol use: Yes    Alcohol/week: 2.0 standard drinks    Types: 2 Standard drinks or equivalent per week    Comment: 2-4 drinks per week per pt   Drug use: No   Sexual activity: Not on file

## 2021-05-01 ENCOUNTER — Other Ambulatory Visit: Payer: Self-pay | Admitting: Otolaryngology

## 2021-05-01 DIAGNOSIS — D333 Benign neoplasm of cranial nerves: Secondary | ICD-10-CM

## 2021-05-07 ENCOUNTER — Ambulatory Visit (INDEPENDENT_AMBULATORY_CARE_PROVIDER_SITE_OTHER): Payer: BC Managed Care – PPO

## 2021-05-07 ENCOUNTER — Other Ambulatory Visit: Payer: Self-pay

## 2021-05-07 ENCOUNTER — Encounter: Payer: Self-pay | Admitting: Orthopaedic Surgery

## 2021-05-07 ENCOUNTER — Ambulatory Visit (INDEPENDENT_AMBULATORY_CARE_PROVIDER_SITE_OTHER): Payer: BC Managed Care – PPO | Admitting: Orthopaedic Surgery

## 2021-05-07 DIAGNOSIS — Z96641 Presence of right artificial hip joint: Secondary | ICD-10-CM | POA: Diagnosis not present

## 2021-05-07 NOTE — Progress Notes (Signed)
Post-Op Visit Note   Patient: Gabriel Oconnell           Date of Birth: Sep 21, 1965           MRN: [id number] Visit Date: 05/07/2021 PCP: Darreld Mclean, MD   Assessment & Plan:  Chief Complaint:  Chief Complaint  Patient presents with   Right Hip - Pain   Visit Diagnoses:  1. History of total hip replacement, right     Plan: Gabriel Oconnell is a 6-week status post right total hip replacement.  He is doing well overall.  Experiences some discomfort at times.  Not taking any pain medications.  He is doing home exercises and riding a stationary bike.  Right lower extremity shows a healed surgical incision.  No signs of infection.  Leg lengths are equal.  He has good gait and ambulation.  X-rays show stable right total hip replacement without any complications.  Gabriel Oconnell is doing really well from his right hip replacement.  He has no complaints.  Dental prophylaxis reinforced.  Recheck in 6 weeks.  Follow-Up Instructions: Return in about 6 weeks (around 06/18/2021).   Orders:  Orders Placed This Encounter  Procedures   XR Pelvis 1-2 Views   No orders of the defined types were placed in this encounter.   Imaging: XR Pelvis 1-2 Views  Result Date: 05/07/2021 Stable total hip replacement without complications   PMFS History: Patient Active Problem List   Diagnosis Date Noted   Primary osteoarthritis of right hip 03/27/2021   Status post total replacement of right hip 03/27/2021   Status post total replacement of left hip 06/13/2020   Chronic left-sided low back pain with left-sided sciatica 02/05/2020   Prediabetes 01/24/2020   Obesity 07/03/2015   Right foot pain 04/30/2015   Left foot pain 06/27/2014   Vestibular schwannoma ([place]) 12/06/2012   Ringing in right ear 10/12/2012   Hip pain 07/07/2012   Family hx of colon cancer 09/29/2010   DANDRUFF 08/07/2008   LIPOMA 03/25/2008   Hyperlipidemia 08/24/2006   Essential hypertension 08/24/2006   Past Medical History:  Diagnosis  Date   Acoustic neuroma ([place])    Hearing loss    right ear -no hearing aids   Hyperlipidemia    Hypertension    PONV (postoperative nausea and vomiting)    Dry Heaves after surgery 2022    Family History  Problem Relation Age of Onset   Pancreatic cancer Father    Colon polyps Father    Colon cancer Father        late 33's   Colon cancer Paternal Grandmother        early 48's   Heart disease Mother     Past Surgical History:  Procedure Laterality Date   COLONOSCOPY     KNEE SURGERY     left   TOTAL HIP ARTHROPLASTY Left 06/13/2020   Procedure: LEFT TOTAL HIP ARTHROPLASTY ANTERIOR APPROACH;  Surgeon: Leandrew Koyanagi, MD;  Location[facility];  Service: Orthopedics;  Laterality: Left;  3C   TOTAL HIP ARTHROPLASTY Right 03/27/2021   Procedure: RIGHT TOTAL HIP ARTHROPLASTY ANTERIOR APPROACH;  Surgeon: Leandrew Koyanagi, MD;  Location: [facility];  Service: Orthopedics;  Laterality: Right;  3-C   WISDOM TOOTH EXTRACTION     Social History   Occupational History   Not on file  Tobacco Use   Smoking status: Former    Types: Cigarettes    Quit date: 10/13/1995    Years since quitting: 25.5  Smokeless tobacco: Never   Tobacco comments:    social smoker - quit 1997  Vaping Use   Vaping Use: Never used  Substance and Sexual Activity   Alcohol use: Yes    Alcohol/week: 2.0 standard drinks    Types: 2 Standard drinks or equivalent per week    Comment: 2-4 drinks per week per pt   Drug use: No   Sexual activity: Not on file

## 2021-05-14 ENCOUNTER — Encounter: Payer: Self-pay | Admitting: Family Medicine

## 2021-05-17 ENCOUNTER — Ambulatory Visit
Admission: RE | Admit: 2021-05-17 | Discharge: 2021-05-17 | Disposition: A | Discharge status: Home / Self Care | Payer: BC Managed Care – PPO | Source: Ambulatory Visit | Attending: Otolaryngology | Admitting: Otolaryngology

## 2021-05-17 DIAGNOSIS — D333 Benign neoplasm of cranial nerves: Secondary | ICD-10-CM

## 2021-05-17 MED ORDER — GADOBENATE DIMEGLUMINE 529 MG/ML IV SOLN
20.0000 mL | Freq: Once | INTRAVENOUS | Status: AC | PRN
  Administered 2021-05-17: 12:00:00 20 mL via INTRAVENOUS

## 2021-05-18 DIAGNOSIS — H90A21 Sensorineural hearing loss, unilateral, right ear, with restricted hearing on the contralateral side: Secondary | ICD-10-CM | POA: Diagnosis not present

## 2021-05-18 DIAGNOSIS — H6123 Impacted cerumen, bilateral: Secondary | ICD-10-CM | POA: Diagnosis not present

## 2021-05-18 DIAGNOSIS — H903 Sensorineural hearing loss, bilateral: Secondary | ICD-10-CM | POA: Diagnosis not present

## 2021-06-16 ENCOUNTER — Ambulatory Visit (INDEPENDENT_AMBULATORY_CARE_PROVIDER_SITE_OTHER): Payer: BC Managed Care – PPO | Admitting: Orthopaedic Surgery

## 2021-06-16 ENCOUNTER — Encounter: Payer: Self-pay | Admitting: Orthopaedic Surgery

## 2021-06-16 ENCOUNTER — Other Ambulatory Visit: Payer: Self-pay

## 2021-06-16 DIAGNOSIS — Z96642 Presence of left artificial hip joint: Secondary | ICD-10-CM

## 2021-06-16 DIAGNOSIS — Z96641 Presence of right artificial hip joint: Secondary | ICD-10-CM

## 2021-06-16 NOTE — Progress Notes (Signed)
Post-Op Visit Note   Patient: Gabriel Oconnell           Date of Birth: 22-Jul-1965           MRN: [id number] Visit Date: 06/16/2021 PCP: Darreld Mclean, MD   Assessment & Plan:  Chief Complaint:  Chief Complaint  Patient presents with   Right Hip - Pain   Visit Diagnoses:  1. History of total hip replacement, right   2. Status post total hip replacement, left     Plan: Patient is a pleasant 56 year old gentleman who comes in today 3 months status post right total hip replacement, date of surgery 03/27/2021 and 12 months status post left total hip replacement, date of surgery 06/13/2020.  He has been doing fairly well however he does complain of slight discomfort to the groin on both sides with hip flexion.  This has not become less frequent with time.  He has been working on exercises at home.  He does not require any pain medication.  Examination of both hips reveal painless logroll.  He does have slight discomfort with resisted hip flexion both sides.  No pain with abduction or abduction.  No focal weakness.  He is neurovascular intact distally.  At this point, recommended that he attend 1 therapy session where they were layout of home exercise program for hip flexor strengthening.  Dental prophylaxis reinforced.  He will follow-up with [place] in 3 months time for repeat evaluation and AP pelvis x-rays.  Call with concerns or questions in the meantime.  Follow-Up Instructions: Return in about 3 months (around 09/14/2021).   Orders:  Orders Placed This Encounter  Procedures   Ambulatory referral to Physical Therapy   No orders of the defined types were placed in this encounter.   Imaging: No new imaging  PMFS History: Patient Active Problem List   Diagnosis Date Noted   Primary osteoarthritis of right hip 03/27/2021   Status post total replacement of right hip 03/27/2021   Status post total replacement of left hip 06/13/2020   Chronic left-sided low back pain with left-sided  sciatica 02/05/2020   Prediabetes 01/24/2020   Obesity 07/03/2015   Right foot pain 04/30/2015   Left foot pain 06/27/2014   Vestibular schwannoma ([place]) 12/06/2012   Ringing in right ear 10/12/2012   Hip pain 07/07/2012   Family hx of colon cancer 09/29/2010   DANDRUFF 08/07/2008   LIPOMA 03/25/2008   Hyperlipidemia 08/24/2006   Essential hypertension 08/24/2006   Past Medical History:  Diagnosis Date   Acoustic neuroma ([place])    Hearing loss    right ear -no hearing aids   Hyperlipidemia    Hypertension    PONV (postoperative nausea and vomiting)    Dry Heaves after surgery 2022    Family History  Problem Relation Age of Onset   Pancreatic cancer Father    Colon polyps Father    Colon cancer Father        late 83's   Colon cancer Paternal Grandmother        early 28's   Heart disease Mother     Past Surgical History:  Procedure Laterality Date   COLONOSCOPY     KNEE SURGERY     left   TOTAL HIP ARTHROPLASTY Left 06/13/2020   Procedure: LEFT TOTAL HIP ARTHROPLASTY ANTERIOR APPROACH;  Surgeon: Leandrew Koyanagi, MD;  [facility];  Service: Orthopedics;  Laterality: Left;  3C   TOTAL HIP ARTHROPLASTY Right 03/27/2021   Procedure:  RIGHT TOTAL HIP ARTHROPLASTY ANTERIOR APPROACH;  Surgeon: Leandrew Koyanagi, MD;  Location: [facility];  Service: Orthopedics;  Laterality: Right;  3-C   WISDOM TOOTH EXTRACTION     Social History   Occupational History   Not on file  Tobacco Use   Smoking status: Former    Types: Cigarettes    Quit date: 10/13/1995    Years since quitting: 25.6   Smokeless tobacco: Never   Tobacco comments:    social smoker - quit 1997  Vaping Use   Vaping Use: Never used  Substance and Sexual Activity   Alcohol use: Yes    Alcohol/week: 2.0 standard drinks    Types: 2 Standard drinks or equivalent per week    Comment: 2-4 drinks per week per pt   Drug use: No   Sexual activity: Not on file

## 2021-06-30 ENCOUNTER — Ambulatory Visit: Payer: BC Managed Care – PPO | Admitting: Physical Therapy

## 2021-07-27 ENCOUNTER — Encounter: Payer: Self-pay | Admitting: Family Medicine

## 2021-07-27 DIAGNOSIS — Z1211 Encounter for screening for malignant neoplasm of colon: Secondary | ICD-10-CM

## 2021-07-31 ENCOUNTER — Encounter: Payer: Self-pay | Admitting: Gastroenterology

## 2021-08-26 ENCOUNTER — Encounter: Payer: Self-pay | Admitting: *Deleted

## 2021-09-07 ENCOUNTER — Encounter: Payer: Self-pay | Admitting: Family Medicine

## 2021-09-07 ENCOUNTER — Other Ambulatory Visit (INDEPENDENT_AMBULATORY_CARE_PROVIDER_SITE_OTHER): Payer: BC Managed Care – PPO

## 2021-09-07 DIAGNOSIS — R350 Frequency of micturition: Secondary | ICD-10-CM

## 2021-09-07 DIAGNOSIS — R972 Elevated prostate specific antigen [PSA]: Secondary | ICD-10-CM | POA: Diagnosis not present

## 2021-09-07 DIAGNOSIS — R351 Nocturia: Secondary | ICD-10-CM | POA: Diagnosis not present

## 2021-09-07 NOTE — Telephone Encounter (Signed)
I added a urine culture on for nocturia.  ?

## 2021-09-08 ENCOUNTER — Encounter: Payer: Self-pay | Admitting: Family Medicine

## 2021-09-08 LAB — URINE CULTURE
MICRO NUMBER:: 13242136
Result:: NO GROWTH
SPECIMEN QUALITY:: ADEQUATE

## 2021-09-08 LAB — PSA: PSA: 1.3 ng/mL (ref 0.10–4.00)

## 2021-09-09 ENCOUNTER — Encounter: Payer: Self-pay | Admitting: Family Medicine

## 2021-09-11 ENCOUNTER — Ambulatory Visit (AMBULATORY_SURGERY_CENTER): Payer: BC Managed Care – PPO | Admitting: *Deleted

## 2021-09-11 VITALS — Ht 73.0 in | Wt 240.0 lb

## 2021-09-11 DIAGNOSIS — Z860100 Personal history of colon polyps, unspecified: Secondary | ICD-10-CM

## 2021-09-11 DIAGNOSIS — Z800 Family history of malignant neoplasm of digestive organs: Secondary | ICD-10-CM

## 2021-09-11 MED ORDER — NA SULFATE-K SULFATE-MG SULF 17.5-3.13-1.6 GM/177ML PO SOLN
1.0000 | Freq: Once | ORAL | 0 refills | Status: AC
Start: 2021-09-11 — End: 2021-09-11

## 2021-09-11 NOTE — Progress Notes (Signed)
No egg or soy allergy known to patient  ? issues known to pt with past sedation with any surgeries or procedures of PONV  ?Patient denies ever being told they had issues or difficulty with intubation  ?No FH of Malignant Hyperthermia ?Pt is not on diet pills ?Pt is not on  home 02  ?Pt is not on blood thinners  ?Pt denies issues with constipation  ?No A fib or A flutter ? ? NO PA's for preps discussed with pt In PV today  ?Discussed with pt there will be an out-of-pocket cost for prep and that varies from $0 to 70 +  dollars - pt verbalized understanding  ?Pt instructed to use Singlecare.com or GoodRx for a price reduction on prep  ? ?PV completed over the phone. Pt verified name, DOB, address and insurance during PV today.  ?Pt mailed instruction packet with copy of consent form to read and not return, and instructions.  ?Pt encouraged to call with questions or issues.  ?If pt has My chart, procedure instructions sent via My Chart  ? ?

## 2021-09-15 ENCOUNTER — Ambulatory Visit (INDEPENDENT_AMBULATORY_CARE_PROVIDER_SITE_OTHER): Payer: BC Managed Care – PPO | Admitting: Orthopaedic Surgery

## 2021-09-15 ENCOUNTER — Ambulatory Visit: Payer: BC Managed Care – PPO | Admitting: Orthopaedic Surgery

## 2021-09-15 ENCOUNTER — Ambulatory Visit (INDEPENDENT_AMBULATORY_CARE_PROVIDER_SITE_OTHER): Payer: BC Managed Care – PPO

## 2021-09-15 DIAGNOSIS — Z96643 Presence of artificial hip joint, bilateral: Secondary | ICD-10-CM

## 2021-09-15 DIAGNOSIS — Z96641 Presence of right artificial hip joint: Secondary | ICD-10-CM

## 2021-09-15 DIAGNOSIS — Z96642 Presence of left artificial hip joint: Secondary | ICD-10-CM

## 2021-09-15 NOTE — Progress Notes (Signed)
? ?Office Visit Note ?  ?Patient: Gabriel Oconnell           ?Date of Birth: 09/24/1965           ?MRN: [id number] ?Visit Date: 09/15/2021 ?             ?Requested by: Darreld Mclean, MD ?[address] ?[address] ?[place],  [place] [address] ?PCP: Darreld Mclean, MD ? ? ?Assessment & Plan: ?Visit Diagnoses:  ?1. History of total hip replacement, right   ?2. Status post total hip replacement, left   ? ? ?Plan: [name] is status post left total hip replacement on 06/13/2020 and status post right total hip replacement on 03/27/2021.  He has done very well from the surgeries.  He has no complaints.  He has resumed all normal activities. ? ?Examination of the hips show fully healed surgical scars.  Normal gait and ambulation.  Painless range of motion. ? ?The implants are stable and clinically doing very well.  Dental prophylaxis reinforced.  Recheck in a year with standing AP pelvis x-rays. ? ?Follow-Up Instructions: Return in about 1 year (around 09/16/2022).  ? ?Orders:  ?Orders Placed This Encounter  ?Procedures  ? XR Pelvis 1-2 Views  ? ?No orders of the defined types were placed in this encounter. ? ? ? ? Procedures: ?No procedures performed ? ? ?Clinical Data: ?No additional findings. ? ? ?Subjective: ?Chief Complaint  ?Patient presents with  ? Right Hip - Pain, Follow-up  ? ? ?HPI ? ?Review of Systems ? ? ?Objective: ?Vital Signs: There were no vitals taken for this visit. ? ?Physical Exam ? ?Ortho Exam ? ?Specialty Comments:  ?No specialty comments available. ? ?Imaging: ?XR Pelvis 1-2 Views ? ?Result Date: 09/15/2021 ?Stable bilateral total hip replacements without any complications.  ? ? ?PMFS History: ?Patient Active Problem List  ? Diagnosis Date Noted  ? Primary osteoarthritis of right hip 03/27/2021  ? Status post total replacement of right hip 03/27/2021  ? Status post total replacement of left hip 06/13/2020  ? Chronic left-sided low back pain with left-sided sciatica 02/05/2020  ? Prediabetes 01/24/2020   ? Obesity 07/03/2015  ? Right foot pain 04/30/2015  ? Left foot pain 06/27/2014  ? Vestibular schwannoma ([place]) 12/06/2012  ? Ringing in right ear 10/12/2012  ? Hip pain 07/07/2012  ? Family hx of colon cancer 09/29/2010  ? DANDRUFF 08/07/2008  ? LIPOMA 03/25/2008  ? Hyperlipidemia 08/24/2006  ? Essential hypertension 08/24/2006  ? ?Past Medical History:  ?Diagnosis Date  ? Acoustic neuroma ([place])   ? right  ? Allergy   ? Hearing loss   ? right ear -no hearing aids  ? Hyperlipidemia   ? Hypertension   ? PONV (postoperative nausea and vomiting)   ? Dry Heaves after surgery 2022  ?  ?Family History  ?Problem Relation Age of Onset  ? Heart disease Mother   ? Pancreatic cancer Father   ? Colon polyps Father   ? Colon cancer Father   ?     late 27's  ? Colon cancer Paternal Grandmother   ?     early 105's  ? Esophageal cancer Neg Hx   ? Rectal cancer Neg Hx   ? Stomach cancer Neg Hx   ?  ?Past Surgical History:  ?Procedure Laterality Date  ? COLONOSCOPY    ? x2  ? KNEE SURGERY    ? left  ? TOTAL HIP ARTHROPLASTY Left 06/13/2020  ? Procedure: LEFT TOTAL  HIP ARTHROPLASTY ANTERIOR APPROACH;  Surgeon: Leandrew Koyanagi, MD;  [facility];  Service: Orthopedics;  Laterality: Left;  3C  ? TOTAL HIP ARTHROPLASTY Right 03/27/2021  ? Procedure: RIGHT TOTAL HIP ARTHROPLASTY ANTERIOR APPROACH;  Surgeon: Leandrew Koyanagi, MD;  Location[facility];  Service: Orthopedics;  Laterality: Right;  3-C  ? WISDOM TOOTH EXTRACTION    ? ?Social History  ? ?Occupational History  ? Not on file  ?Tobacco Use  ? Smoking status: Former  ?  Types: Cigarettes  ?  Quit date: 10/13/1995  ?  Years since quitting: 25.9  ? Smokeless tobacco: Never  ? Tobacco comments:  ?  social smoker - quit 1997  ?Vaping Use  ? Vaping Use: Never used  ?Substance and Sexual Activity  ? Alcohol use: Yes  ?  Alcohol/week: 2.0 standard drinks  ?  Types: 2 Standard drinks or equivalent per week  ?  Comment: 2-4 drinks per week per pt  ? Drug use: No  ? Sexual activity: Not on file   ? ? ? ? ? ? ?

## 2021-09-17 ENCOUNTER — Encounter: Payer: Self-pay | Admitting: Gastroenterology

## 2021-09-25 ENCOUNTER — Encounter: Payer: Self-pay | Admitting: Gastroenterology

## 2021-09-25 ENCOUNTER — Ambulatory Visit (AMBULATORY_SURGERY_CENTER): Payer: BC Managed Care – PPO | Admitting: Gastroenterology

## 2021-09-25 VITALS — BP 132/77 | HR 66 | Temp 98.4°F | Resp 16 | Ht 72.0 in | Wt 240.0 lb

## 2021-09-25 DIAGNOSIS — D125 Benign neoplasm of sigmoid colon: Secondary | ICD-10-CM

## 2021-09-25 DIAGNOSIS — Z860100 Personal history of colon polyps, unspecified: Secondary | ICD-10-CM

## 2021-09-25 DIAGNOSIS — D123 Benign neoplasm of transverse colon: Secondary | ICD-10-CM | POA: Diagnosis not present

## 2021-09-25 DIAGNOSIS — Z1211 Encounter for screening for malignant neoplasm of colon: Secondary | ICD-10-CM | POA: Diagnosis not present

## 2021-09-25 DIAGNOSIS — Z800 Family history of malignant neoplasm of digestive organs: Secondary | ICD-10-CM | POA: Diagnosis not present

## 2021-09-25 MED ORDER — SODIUM CHLORIDE 0.9 % IV SOLN
500.0000 mL | Freq: Once | INTRAVENOUS | Status: DC
Start: 2021-09-25 — End: 2021-09-25

## 2021-09-25 NOTE — Progress Notes (Signed)
VS by CW  Pt's states no medical or surgical changes since previsit or office visit.  

## 2021-09-25 NOTE — Op Note (Signed)
[place] ?Patient Name: Gabriel Oconnell ?Procedure Date: 09/25/2021 10:15 AM ?MRN: [id number] ?Endoscopist: Milus Banister , MD ?Age: 56 ?Referring MD:  ?Date of Birth: [birth year] ?Gender: Male ?Account #: [id number] ?Procedure:                Colonoscopy ?Indications:              Screening in patient at increased risk: Family  ?                          history of 1st-degree relative with colorectal  ?                          cancer before age 74 years; Father had colon cancer  ?                          in his late 12s ?Medicines:                Monitored Anesthesia Care ?Procedure:                Pre-Anesthesia Assessment: ?                          - Prior to the procedure, a History and Physical  ?                          was performed, and patient medications and  ?                          allergies were reviewed. The patient's tolerance of  ?                          previous anesthesia was also reviewed. The risks  ?                          and benefits of the procedure and the sedation  ?                          options and risks were discussed with the patient.  ?                          All questions were answered, and informed consent  ?                          was obtained. Prior Anticoagulants: The patient has  ?                          taken no previous anticoagulant or antiplatelet  ?                          agents. ASA Grade Assessment: II - A patient with  ?                          mild systemic disease. After reviewing the risks  ?  and benefits, the patient was deemed in  ?                          satisfactory condition to undergo the procedure. ?                          After obtaining informed consent, the colonoscope  ?                          was passed under direct vision. Throughout the  ?                          procedure, the patient's blood pressure, pulse, and  ?                          oxygen saturations were monitored continuously.  The  ?                          Olympus CF-[id number] (#[id number]) Colonoscope was  ?                          introduced through the anus and advanced to the the  ?                          cecum, identified by appendiceal orifice and  ?                          ileocecal valve. The colonoscopy was performed  ?                          without difficulty. The patient tolerated the  ?                          procedure well. The quality of the bowel  ?                          preparation was good. The ileocecal valve,  ?                          appendiceal orifice, and rectum were photographed. ?Scope In: 10:37:07 AM ?Scope Out: 10:51:58 AM ?Scope Withdrawal Time: 0 hours 11 minutes 2 seconds  ?Total Procedure Duration: 0 hours 14 minutes 51 seconds  ?Findings:                 Two sessile polyps were found in the sigmoid colon  ?                          and transverse colon. The polyps were 1 to 2 mm in  ?                          size. These polyps were removed with a cold snare.  ?                          Resection and retrieval were  complete. ?                          The exam was otherwise without abnormality on  ?                          direct and retroflexion views. ?Complications:            No immediate complications. Estimated blood loss:  ?                          None. ?Estimated Blood Loss:     Estimated blood loss: none. ?Impression:               - Two 1 to 2 mm polyps in the sigmoid colon and in  ?                          the transverse colon, removed with a cold snare.  ?                          Resected and retrieved. ?                          - The examination was otherwise normal on direct  ?                          and retroflexion views. ?Recommendation:           - Patient has a contact number available for  ?                          emergencies. The signs and symptoms of potential  ?                          delayed complications were discussed with the  ?                           patient. Return to normal activities tomorrow.  ?                          Written discharge instructions were provided to the  ?                          patient. ?                          - Resume previous diet. ?                          - Continue present medications. ?                          - Await pathology results. ?Milus Banister, MD ?09/25/2021 10:53:54 AM ?This report has been signed electronically. ?

## 2021-09-25 NOTE — Patient Instructions (Signed)
Handout provided on polyps.   YOU HAD AN ENDOSCOPIC PROCEDURE TODAY AT THE [facility]:   Refer to the procedure report that was given to you for any specific questions about what was found during the examination.  If the procedure report does not answer your questions, please call your gastroenterologist to clarify.  If you requested that your care partner not be given the details of your procedure findings, then the procedure report has been included in a sealed envelope for you to review at your convenience later.  YOU SHOULD EXPECT: Some feelings of bloating in the abdomen. Passage of more gas than usual.  Walking can help get rid of the air that was put into your GI tract during the procedure and reduce the bloating. If you had a lower endoscopy (such as a colonoscopy or flexible sigmoidoscopy) you may notice spotting of blood in your stool or on the toilet paper. If you underwent a bowel prep for your procedure, you may not have a normal bowel movement for a few days.  Please Note:  You might notice some irritation and congestion in your nose or some drainage.  This is from the oxygen used during your procedure.  There is no need for concern and it should clear up in a day or so.  SYMPTOMS TO REPORT IMMEDIATELY:  Following lower endoscopy (colonoscopy or flexible sigmoidoscopy):  Excessive amounts of blood in the stool  Significant tenderness or worsening of abdominal pains  Swelling of the abdomen that is new, acute  Fever of 100F or higher  For urgent or emergent issues, a gastroenterologist can be reached at any hour by calling [phone]. Do not use MyChart messaging for urgent concerns.    DIET:  We do recommend a small meal at first, but then you may proceed to your regular diet.  Drink plenty of fluids but you should avoid alcoholic beverages for 24 hours.  ACTIVITY:  You should plan to take it easy for the rest of today and you should NOT DRIVE or use heavy  machinery until tomorrow (because of the sedation medicines used during the test).    FOLLOW UP: Our staff will call the number listed on your records 48-72 hours following your procedure to check on you and address any questions or concerns that you may have regarding the information given to you following your procedure. If we do not reach you, we will leave a message.  We will attempt to reach you two times.  During this call, we will ask if you have developed any symptoms of COVID 19. If you develop any symptoms (ie: fever, flu-like symptoms, shortness of breath, cough etc.) before then, please call [phone].  If you test positive for Covid 19 in the 2 weeks post procedure, please call and report this information to us.    If any biopsies were taken you will be contacted by phone or by letter within the next 1-3 weeks.  Please call us at [phone] if you have not heard about the biopsies in 3 weeks.    SIGNATURES/CONFIDENTIALITY: You and/or your care partner have signed paperwork which will be entered into your electronic medical record.  These signatures attest to the fact that that the information above on your After Visit Summary has been reviewed and is understood.  Full responsibility of the confidentiality of this discharge information lies with you and/or your care-partner.  

## 2021-09-25 NOTE — Progress Notes (Signed)
To Pacu, VSS. Report to [email or web address] 

## 2021-09-25 NOTE — Progress Notes (Signed)
HPI: ?This is a man with FH of CRC ? ?Father had colon cancer.  Colonoscopy 2017 Dr. Ardis Hughs found a single subCM adenoma. ? ? ?ROS: complete GI ROS as described in HPI, all other review negative. ? ?Constitutional:  No unintentional weight loss ? ? ?Past Medical History:  ?Diagnosis Date  ? Acoustic neuroma ([place])   ? right  ? Allergy   ? Hearing loss   ? right ear -no hearing aids  ? Hyperlipidemia   ? Hypertension   ? PONV (postoperative nausea and vomiting)   ? Dry Heaves after surgery 2022  ? ? ?Past Surgical History:  ?Procedure Laterality Date  ? COLONOSCOPY    ? x2  ? KNEE SURGERY    ? left  ? TOTAL HIP ARTHROPLASTY Left 06/13/2020  ? Procedure: LEFT TOTAL HIP ARTHROPLASTY ANTERIOR APPROACH;  Surgeon: Leandrew Koyanagi, MD;  Location: [facility];  Service: Orthopedics;  Laterality: Left;  3C  ? TOTAL HIP ARTHROPLASTY Right 03/27/2021  ? Procedure: RIGHT TOTAL HIP ARTHROPLASTY ANTERIOR APPROACH;  Surgeon: Leandrew Koyanagi, MD;  Location: [facility];  Service: Orthopedics;  Laterality: Right;  3-C  ? WISDOM TOOTH EXTRACTION    ? ? ?Current Outpatient Medications  ?Medication Sig Dispense Refill  ? acetaminophen (TYLENOL) 500 MG tablet Take 1,000 mg by mouth every 4 (four) hours as needed (pain).    ? amLODipine (NORVASC) 10 MG tablet Take 1 tablet (10 mg total) by mouth daily. 90 tablet 3  ? amoxicillin (AMOXIL) 500 MG capsule Take 4 capsules (2,000 mg total) by mouth See admin instructions. Take 4 capsules (2000 mg) by mouth 1 hour prior to dental appointment (Patient not taking: Reported on 09/11/2021) 12 capsule 0  ? aspirin EC 81 MG tablet Take 1 tablet (81 mg total) by mouth 2 (two) times daily. To be taken after surgery to prevent blood clots 84 tablet 0  ? cetirizine (ZYRTEC) 10 MG tablet Take 10 mg by mouth in the morning.    ? Glucosamine HCl 1500 MG TABS Take 1,500 mg by mouth in the morning.    ? Multiple Vitamin (MULTIVITAMIN WITH MINERALS) TABS tablet Take 1 tablet by mouth in the morning.    ? simvastatin (ZOCOR)  20 MG tablet Take 1 tablet (20 mg total) by mouth daily. TAKE 1 TABLET DAILY 90 tablet 3  ? valsartan-hydrochlorothiazide (DIOVAN-HCT) 320-12.5 MG tablet Take 1 tablet by mouth daily. TAKE 1 TABLET DAILY 90 tablet 3  ? ?Current Facility-Administered Medications  ?Medication Dose Route Frequency Provider Last Rate Last Admin  ? 0.9 %  sodium chloride infusion  500 mL Intravenous Once Milus Banister, MD      ? ? ?Allergies as of 09/25/2021  ? (No Known Allergies)  ? ? ?Family History  ?Problem Relation Age of Onset  ? Heart disease Mother   ? Pancreatic cancer Father   ? Colon polyps Father   ? Colon cancer Father   ?     late 31's  ? Colon cancer Paternal Grandmother   ?     early 21's  ? Esophageal cancer Neg Hx   ? Rectal cancer Neg Hx   ? Stomach cancer Neg Hx   ? ? ?Social History  ? ?Socioeconomic History  ? Marital status: Married  ?  Spouse name: Not on file  ? Number of children: Not on file  ? Years of education: Not on file  ? Highest education level: Not on file  ?Occupational History  ?  Not on file  ?Tobacco Use  ? Smoking status: Former  ?  Types: Cigarettes  ?  Quit date: 10/13/1995  ?  Years since quitting: 25.9  ? Smokeless tobacco: Never  ? Tobacco comments:  ?  social smoker - quit 1997  ?Vaping Use  ? Vaping Use: Never used  ?Substance and Sexual Activity  ? Alcohol use: Yes  ?  Alcohol/week: 2.0 standard drinks  ?  Types: 2 Standard drinks or equivalent per week  ?  Comment: 2-4 drinks per week per pt  ? Drug use: No  ? Sexual activity: Not on file  ?Other Topics Concern  ? Not on file  ?Social History Narrative  ? Not on file  ? ?Social Determinants of Health  ? ?Financial Resource Strain: Not on file  ?Food Insecurity: Not on file  ?Transportation Needs: Not on file  ?Physical Activity: Not on file  ?Stress: Not on file  ?Social Connections: Not on file  ?Intimate Partner Violence: Not on file  ? ? ? ?Physical Exam: ?BP (!) 147/98   Pulse 64   Temp 98.4 ?F (36.9 ?C) (Skin)   Ht 6' (1.829 m)    Wt 240 lb (108.9 kg)   SpO2 97%   BMI 32.55 kg/m?  ?Constitutional: generally well-appearing ?Psychiatric: alert and oriented x3 ?Lungs: CTA bilaterally ?Heart: no MCR ? ?Assessment and plan: ?56 y.o. male with FH CRC, personal history of polyps ? ?Screening colonoscopy today ? ?Care is appropriate for the ambulatory setting. ? ?Owens Loffler, MD ?[facility] ?09/25/2021, 10:16 AM ? ? ? ?

## 2021-09-25 NOTE — Progress Notes (Signed)
Called to room to assist during endoscopic procedure.  Patient ID and intended procedure confirmed with present staff. Received instructions for my participation in the procedure from the performing physician.  

## 2021-09-29 ENCOUNTER — Telehealth: Payer: Self-pay

## 2021-09-29 NOTE — Telephone Encounter (Signed)
?  Follow up Call- ? ? ?  09/25/2021  ? 10:11 AM  ?Call back number  ?Post procedure Call Back phone  # [phone]  ?Permission to leave phone message Yes  ?  ? ?Patient questions: ? ?Do you have a fever, pain , or abdominal swelling? No. ?Pain Score  0 * ? ?Have you tolerated food without any problems? Yes.   ? ?Have you been able to return to your normal activities? Yes.   ? ?Do you have any questions about your discharge instructions: ?Diet   No. ?Medications  No. ?Follow up visit  No. ? ?Do you have questions or concerns about your Care? No. ? ?Actions: ?* If pain score is 4 or above: ?No action needed, pain <4. ? ? ?

## 2021-09-29 NOTE — Telephone Encounter (Signed)
Called [phone] and left a message we tried to reach pt for a follow up call. maw  ?

## 2021-09-30 ENCOUNTER — Encounter: Payer: Self-pay | Admitting: Gastroenterology

## 2022-01-16 IMAGING — MR MR BRAIN/TEMPORAL BONE/IAC
11 of 12 series · 38 of 48 positions shown · IV contrast (multihance)
Comparison: 7037

CLINICAL DATA: Vestibular schwannoma, follow-up

EXAM:
MRI HEAD WITHOUT AND WITH CONTRAST
TECHNIQUE: Multiplanar, multiecho pulse sequences of the brain and surrounding
structures were obtained without and with intravenous contrast.
CONTRAST:  20mL MULTIHANCE GADOBENATE DIMEGLUMINE 529 MG/ML IV SOLN

[Series 2: T1 · sagittal · 5.0mm · 0.45mm/px · 3 of 25 slices shown (1 of 4)]
[im 1/25]
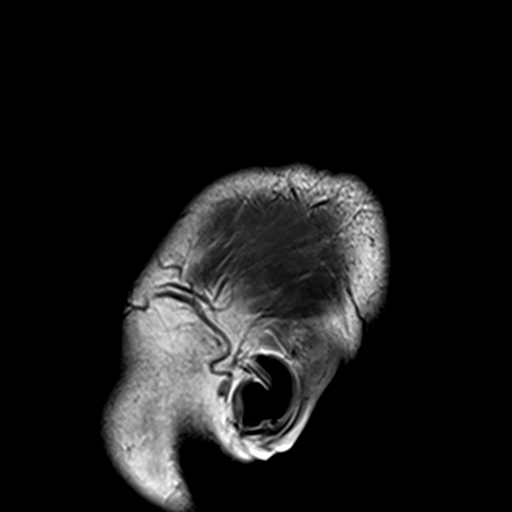
[im 13/25]
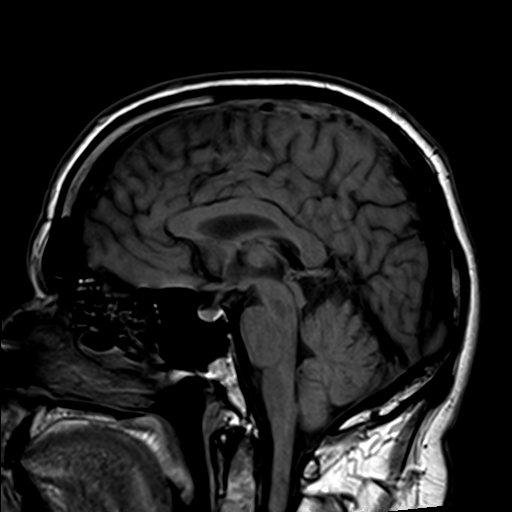
[im 25/25]
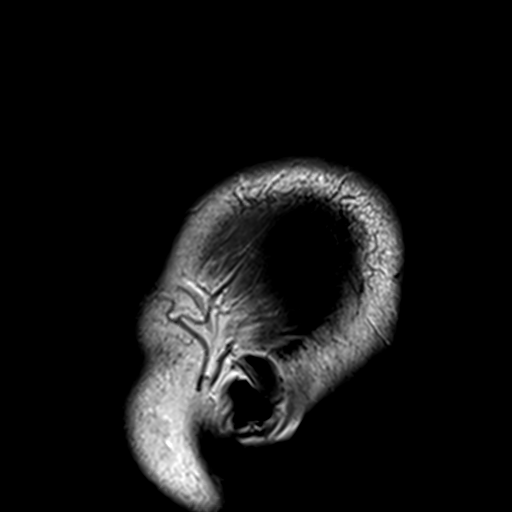

[Series 3: ep2d_diff_3 · axial · 3.0mm · 1.80mm/px · z∈[-52,+102]mm · 8 of 105 slices shown]
[im 1/105]
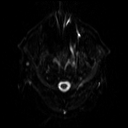
[im 12/105]
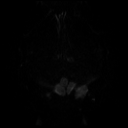
[im 35/105]
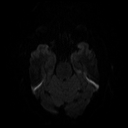
[im 47/105]
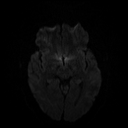
[im 58/105]
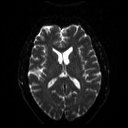
[im 70/105]
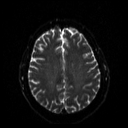
[im 93/105]
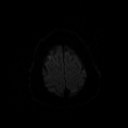
[im 105/105]
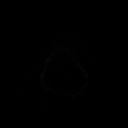

[Series 4: ep2d_diff_3_adc · axial · 3.0mm · 1.80mm/px · z∈[-52,+102]mm · 5 of 53 slices shown]
[im 1/53]
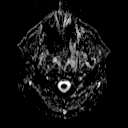
[im 14/53]
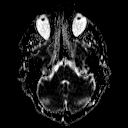
[im 27/53]
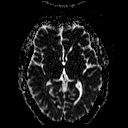
[im 40/53]
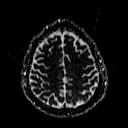
[im 53/53]
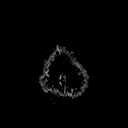

[Series 5: t2_tse_tra_512 · axial · 5.0mm · 0.60mm/px · z∈[-54,+103]mm · 3 of 27 slices shown]
[im 1/27]
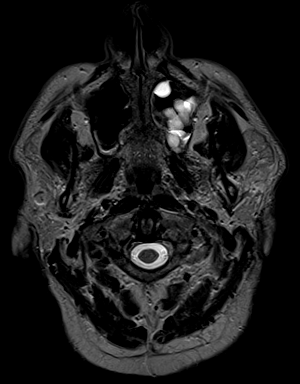
[im 14/27]
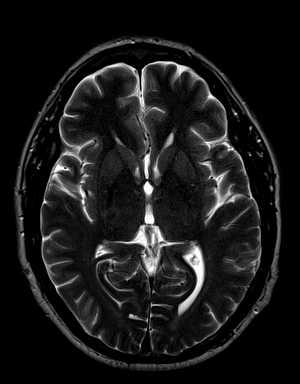
[im 27/27]
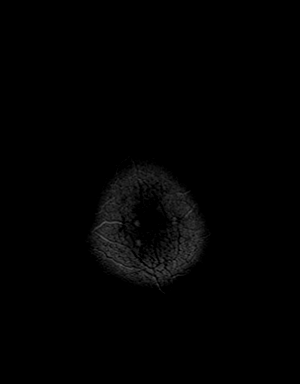

[Series 6: FLAIR · axial · 3.0mm · 0.43mm/px · z∈[-52,+99]mm · 3 of 27 slices shown]
[im 1/27]
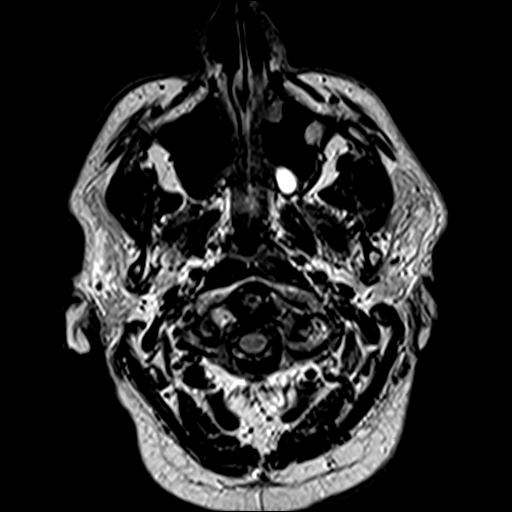
[im 14/27]
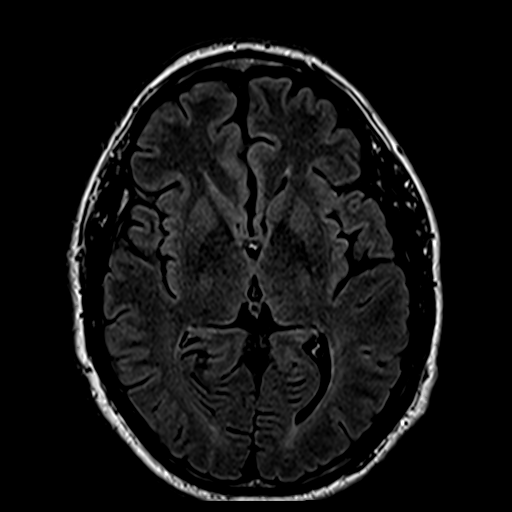
[im 27/27]
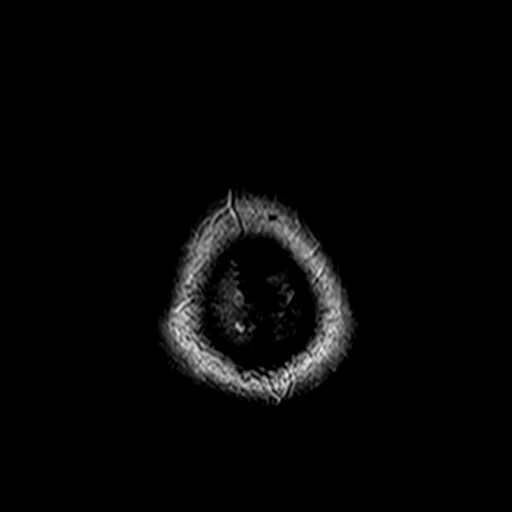

[Series 8: swi_images · axial · 2.0mm · 0.90mm/px · z∈[-52,+101]mm · 8 of 80 slices shown]
[im 1/80]
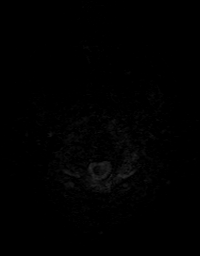
[im 12/80]
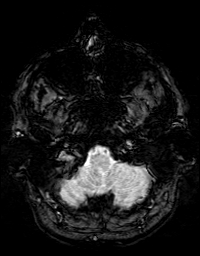
[im 23/80]
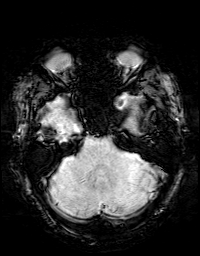
[im 34/80]
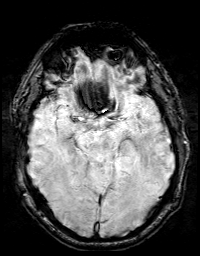
[im 46/80]
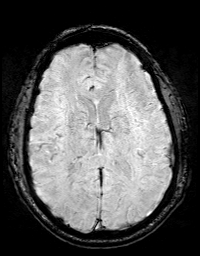
[im 57/80]
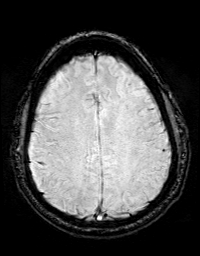
[im 68/80]
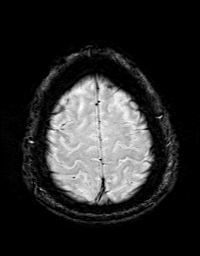
[im 80/80]
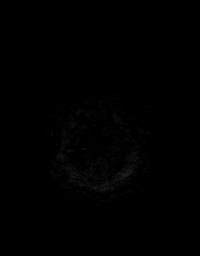

[Series 9: T1 · coronal · 3.0mm · 0.35mm/px · 1 of 11 slices shown (2 of 4)]
[im 1/11]
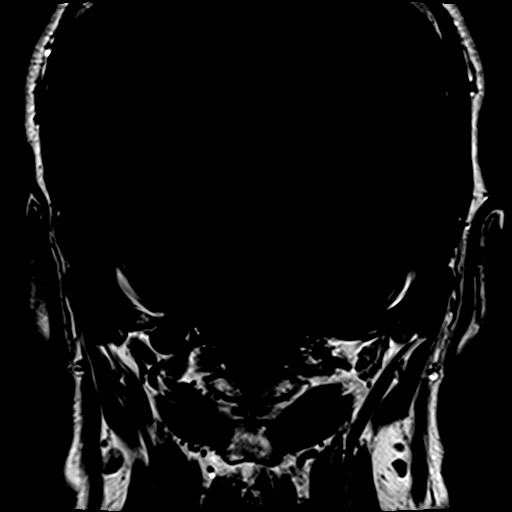

[Series 10: T1 · axial · 3.0mm · 0.35mm/px · 1 of 11 slices shown (3 of 4)]
[im 1/11]
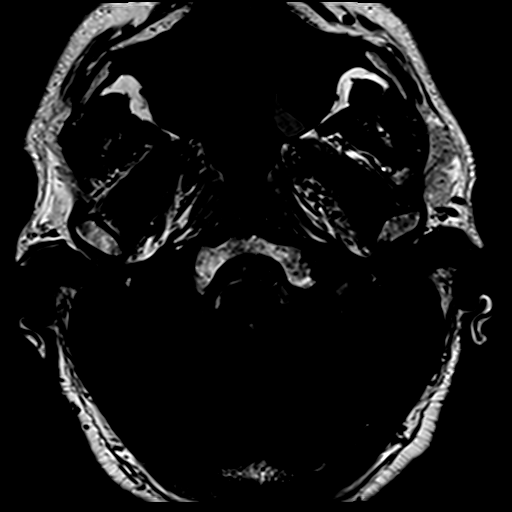

[Series 11: bSSFP · axial · 0.7mm · 0.28mm/px · z∈[-64,-34]mm · 4 of 44 slices shown]
[im 1/44]
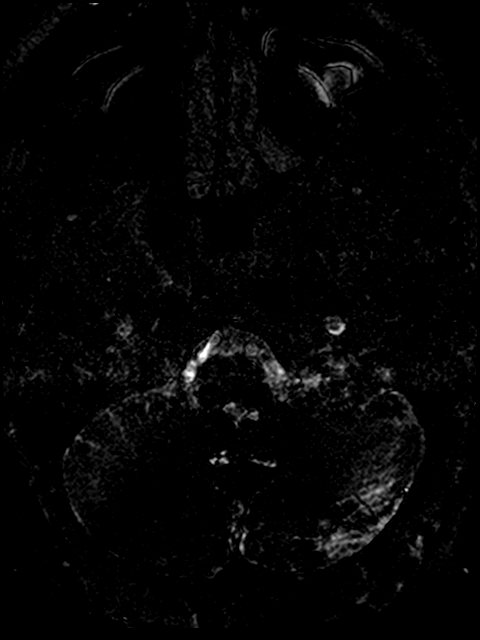
[im 15/44]
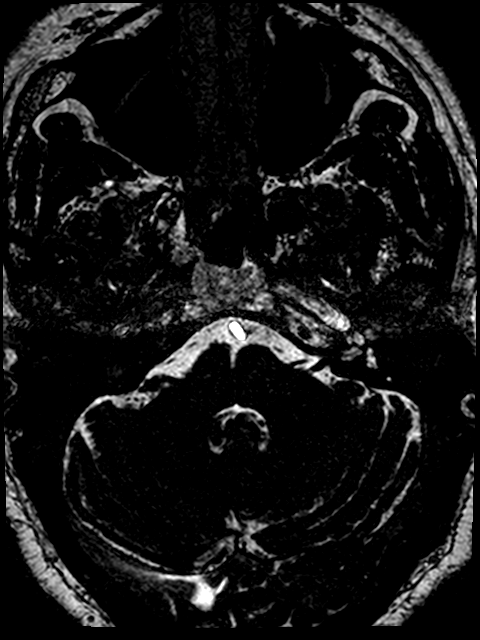
[im 29/44]
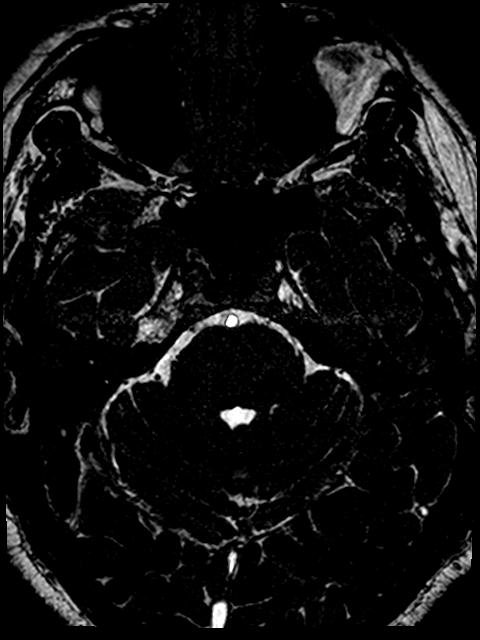
[im 44/44]
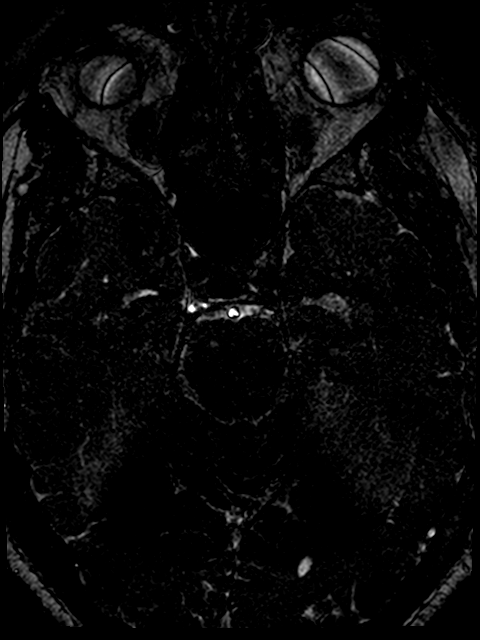

[Series 12: T1 · coronal · 3.0mm · 0.35mm/px · 1 of 11 slices shown (4 of 4)]
[im 1/11]
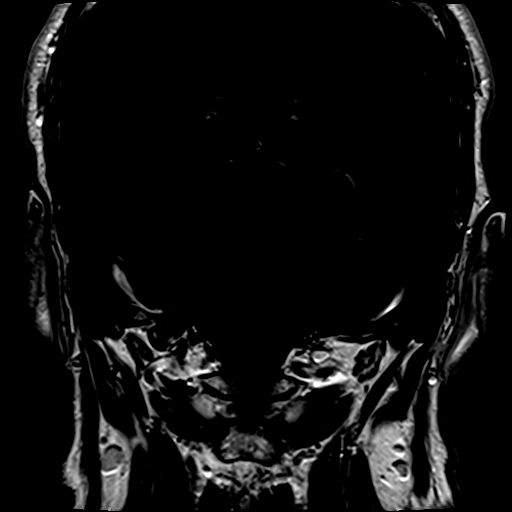

[Series 13: T1 post-contrast · axial · 3.0mm · 0.35mm/px · 1 of 11 slices shown]
[im 1/11]
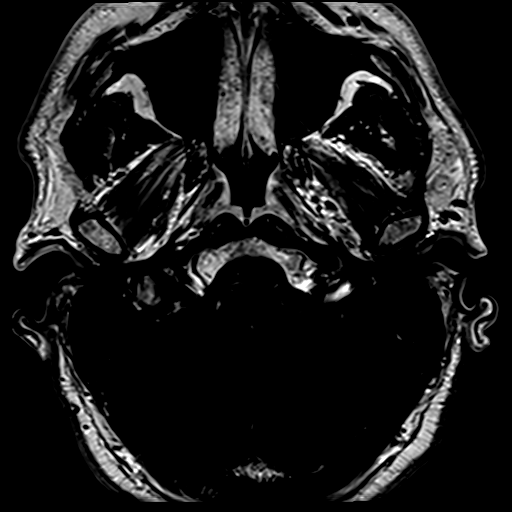

[38 of 48 positions shown; findings below may reference images not displayed]

FINDINGS: Brain: Enhancing mass is again identified within the right internal
auditory canal slightly extending into the cerebellopontine angle
cistern. Presently measures 5 x 12 x 6 mm (previously 6 x 12 x 6
mm). Inner ear structures demonstrate an unremarkable MR appearance.

No acute infarction or intracranial hemorrhage. Ventricles and sulci
are stable in size and configuration. A few punctate foci of T2
hyperintensity are again identified in the cerebral white matter
likely reflecting nonspecific gliosis/demyelination. There is no
mass effect or edema. There is no extra-axial fluid collection. No
new abnormal enhancement.

Vascular: Major vessel flow voids at the skull base are preserved.

Skull and upper cervical spine: Normal marrow signal is preserved.

Sinuses/Orbits: Patchy lobular mucosal thickening. The orbits are
unremarkable.

Other: The sella is unremarkable.  Mastoid air cells are clear.
IMPRESSION: Small decrease in size of right IAC vestibular schwannoma (in the AP
dimension). Otherwise stable appearance.

## 2022-01-21 ENCOUNTER — Other Ambulatory Visit: Payer: Self-pay | Admitting: Family Medicine

## 2022-01-21 DIAGNOSIS — E785 Hyperlipidemia, unspecified: Secondary | ICD-10-CM

## 2022-02-05 ENCOUNTER — Other Ambulatory Visit: Payer: Self-pay | Admitting: Family Medicine

## 2022-02-05 DIAGNOSIS — I10 Essential (primary) hypertension: Secondary | ICD-10-CM

## 2022-02-26 NOTE — Progress Notes (Unsigned)
[place] at [facility] [address], [place], [place] [address] [phone]  Date:  03/03/2022   Name:  Gabriel Oconnell   DOB:  [birth year]   MRN:  [id number]  PCP:  Darreld Mclean, MD    Chief Complaint: No chief complaint on file.   History of Present Illness:  Gabriel Oconnell is a 56 y.o. very pleasant male patient who presents with the following:  Patient seen today for physical exam Most recent visit with myself about 1 year ago He has history of hypertension, hyperlipidemia, overweight, prediabetes, vestibular schwannoma, diagnosed due to right-sided tinnitus.  He is status post bilateral hip replacement with excellent results This is followed by an ENT specialist in [place] he is seen every 2 years and has MRI monitoring.  Married to [employer] grown children.  His daughter is a Marine scientist, son recently graduated with a Radio broadcast assistant degree  COVID shot Flu Tetanus is up-to-date Colonoscopy done earlier this year Can update blood work  PSA trended up a bit last year, went down on recheck this past couple  Amlodipine 10 Valsartan/HCTZ Simvastatin 20  Patient Active Problem List   Diagnosis Date Noted   Primary osteoarthritis of right hip 03/27/2021   Status post total replacement of right hip 03/27/2021   Status post total replacement of left hip 06/13/2020   Chronic left-sided low back pain with left-sided sciatica 02/05/2020   Prediabetes 01/24/2020   Obesity 07/03/2015   Right foot pain 04/30/2015   Left foot pain 06/27/2014   Vestibular schwannoma ([place]) 12/06/2012   Ringing in right ear 10/12/2012   Hip pain 07/07/2012   Family hx of colon cancer 09/29/2010   DANDRUFF 08/07/2008   LIPOMA 03/25/2008   Hyperlipidemia 08/24/2006   Essential hypertension 08/24/2006    Past Medical History:  Diagnosis Date   Acoustic neuroma ([place])    right   Allergy    Hearing loss    right ear -no hearing aids    Hyperlipidemia    Hypertension    PONV (postoperative nausea and vomiting)    Dry Heaves after surgery 2022    Past Surgical History:  Procedure Laterality Date   COLONOSCOPY     x2   KNEE SURGERY     left   TOTAL HIP ARTHROPLASTY Left 06/13/2020   Procedure: LEFT TOTAL HIP ARTHROPLASTY ANTERIOR APPROACH;  Surgeon: Leandrew Koyanagi, MD;  Location: [facility];  Service: Orthopedics;  Laterality: Left;  3C   TOTAL HIP ARTHROPLASTY Right 03/27/2021   Procedure: RIGHT TOTAL HIP ARTHROPLASTY ANTERIOR APPROACH;  Surgeon: Leandrew Koyanagi, MD;  Location: [facility];  Service: Orthopedics;  Laterality: Right;  3-C   WISDOM TOOTH EXTRACTION      Social History   Tobacco Use   Smoking status: Former    Types: Cigarettes    Quit date: 10/13/1995    Years since quitting: 26.3   Smokeless tobacco: Never   Tobacco comments:    social smoker - quit 1997  Vaping Use   Vaping Use: Never used  Substance Use Topics   Alcohol use: Yes    Alcohol/week: 2.0 standard drinks of alcohol    Types: 2 Standard drinks or equivalent per week    Comment: 2-4 drinks per week per pt   Drug use: No    Family History  Problem Relation Age of Onset   Heart disease Mother    Pancreatic cancer Father    Colon polyps  Father    Colon cancer Father        late 93's   Colon cancer Paternal Grandmother        early 13's   Esophageal cancer Neg Hx    Rectal cancer Neg Hx    Stomach cancer Neg Hx     No Known Allergies  Medication list has been reviewed and updated.  Current Outpatient Medications on File Prior to Visit  Medication Sig Dispense Refill   acetaminophen (TYLENOL) 500 MG tablet Take 1,000 mg by mouth every 4 (four) hours as needed (pain).     amLODipine (NORVASC) 10 MG tablet Take 1 tablet (10 mg total) by mouth daily. 90 tablet 1   amoxicillin (AMOXIL) 500 MG capsule Take 4 capsules (2,000 mg total) by mouth See admin instructions. Take 4 capsules (2000 mg) by mouth 1 hour prior to dental appointment  (Patient not taking: Reported on 09/11/2021) 12 capsule 0   aspirin EC 81 MG tablet Take 1 tablet (81 mg total) by mouth 2 (two) times daily. To be taken after surgery to prevent blood clots 84 tablet 0   cetirizine (ZYRTEC) 10 MG tablet Take 10 mg by mouth in the morning.     Glucosamine HCl 1500 MG TABS Take 1,500 mg by mouth in the morning.     Multiple Vitamin (MULTIVITAMIN WITH MINERALS) TABS tablet Take 1 tablet by mouth in the morning.     simvastatin (ZOCOR) 20 MG tablet Take 1 tablet (20 mg total) by mouth daily. 90 tablet 1   valsartan-hydrochlorothiazide (DIOVAN-HCT) 320-12.5 MG tablet Take 1 tablet by mouth daily. 90 tablet 1   No current facility-administered medications on file prior to visit.    Review of Systems:  As per HPI- otherwise negative.   Physical Examination: There were no vitals filed for this visit. There were no vitals filed for this visit. There is no height or weight on file to calculate BMI. Ideal Body Weight:    GEN: no acute distress. HEENT: Atraumatic, Normocephalic.  Ears and Nose: No external deformity. CV: RRR, No M/G/R. No JVD. No thrill. No extra heart sounds. PULM: CTA B, no wheezes, crackles, rhonchi. No retractions. No resp. distress. No accessory muscle use. ABD: S, NT, ND, +BS. No rebound. No HSM. EXTR: No c/c/e PSYCH: Normally interactive. Conversant. '  Assessment and Plan: *** Physical exam today.  Encouraged healthy diet and exercise routine. Will plan further follow- up pending labs.  Signed Lamar Blinks, MD

## 2022-02-26 NOTE — Patient Instructions (Addendum)
It was great to see you again today, I will be in touch with your labs. I recommend getting a COVID shot this fall Flu shot done today Let me know when you need more medication/ refills  If you like, I am glad to order a CT coronary calcium score to get an idea of how much plaque you have in your heart arteries (we will all get this eventually!)- this can help [place] determine if we need to be more aggressive with weight loss/ cholesterol control   We can also do a zio patch to get more long term information about your heart rhythm if you like  Keep up the good work!

## 2022-03-03 ENCOUNTER — Ambulatory Visit (INDEPENDENT_AMBULATORY_CARE_PROVIDER_SITE_OTHER): Payer: BC Managed Care – PPO | Admitting: Family Medicine

## 2022-03-03 ENCOUNTER — Encounter: Payer: Self-pay | Admitting: Family Medicine

## 2022-03-03 VITALS — BP 145/85 | HR 71 | Ht 72.0 in | Wt 247.0 lb

## 2022-03-03 DIAGNOSIS — Z23 Encounter for immunization: Secondary | ICD-10-CM

## 2022-03-03 DIAGNOSIS — E782 Mixed hyperlipidemia: Secondary | ICD-10-CM | POA: Diagnosis not present

## 2022-03-03 DIAGNOSIS — Z0000 Encounter for general adult medical examination without abnormal findings: Secondary | ICD-10-CM | POA: Diagnosis not present

## 2022-03-03 DIAGNOSIS — I10 Essential (primary) hypertension: Secondary | ICD-10-CM

## 2022-03-03 DIAGNOSIS — Z125 Encounter for screening for malignant neoplasm of prostate: Secondary | ICD-10-CM | POA: Diagnosis not present

## 2022-03-03 DIAGNOSIS — Z131 Encounter for screening for diabetes mellitus: Secondary | ICD-10-CM | POA: Diagnosis not present

## 2022-03-03 LAB — COMPREHENSIVE METABOLIC PANEL
ALT: 18 U/L (ref 0–53)
AST: 17 U/L (ref 0–37)
Albumin: 4.5 g/dL (ref 3.5–5.2)
Alkaline Phosphatase: 73 U/L (ref 39–117)
BUN: 14 mg/dL (ref 6–23)
CO2: 28 mEq/L (ref 19–32)
Calcium: 9.6 mg/dL (ref 8.4–10.5)
Chloride: 103 mEq/L (ref 96–112)
Creatinine, Ser: 0.88 mg/dL (ref 0.40–1.50)
GFR: 96.05 mL/min (ref >60.00)
Glucose, Bld: 96 mg/dL (ref 70–99)
Potassium: 4 mEq/L (ref 3.5–5.1)
Sodium: 139 mEq/L (ref 135–145)
Total Bilirubin: 0.8 mg/dL (ref 0.2–1.2)
Total Protein: 7.2 g/dL (ref 6.0–8.3)

## 2022-03-03 LAB — PSA: PSA: 1.16 ng/mL (ref 0.10–4.00)

## 2022-03-03 LAB — CBC
HCT: 42.5 % (ref 39.0–52.0)
Hemoglobin: 14.5 g/dL (ref 13.0–17.0)
MCHC: 34 g/dL (ref 30.0–36.0)
MCV: 96.6 fl (ref 78.0–100.0)
Platelets: 260 10*3/uL (ref 150.0–400.0)
RBC: 4.4 Mil/uL (ref 4.22–5.81)
RDW: 13.2 % (ref 11.5–15.5)
WBC: 7.7 10*3/uL (ref 4.0–10.5)

## 2022-03-03 LAB — LIPID PANEL
Cholesterol: 150 mg/dL (ref 0–200)
HDL: 43.7 mg/dL (ref >39.00)
LDL Cholesterol: 78 mg/dL (ref 0–99)
NonHDL: 106.73
Total CHOL/HDL Ratio: 3
Triglycerides: 145 mg/dL (ref 0.0–149.0)
VLDL: 29 mg/dL (ref 0.0–40.0)

## 2022-03-03 LAB — HEMOGLOBIN A1C: Hgb A1c MFr Bld: 6.1 % (ref 4.6–6.5)

## 2022-05-02 ENCOUNTER — Encounter: Payer: Self-pay | Admitting: Family Medicine

## 2022-05-02 DIAGNOSIS — I10 Essential (primary) hypertension: Secondary | ICD-10-CM

## 2022-05-03 MED ORDER — VALSARTAN-HYDROCHLOROTHIAZIDE 320-12.5 MG PO TABS: 1.0000 | ORAL_TABLET | Freq: Every day | ORAL | 1 refills | Status: DC

## 2022-05-19 ENCOUNTER — Encounter: Payer: Self-pay | Admitting: Orthopaedic Surgery

## 2022-06-01 ENCOUNTER — Other Ambulatory Visit: Payer: Self-pay | Admitting: Physician Assistant

## 2022-06-01 MED ORDER — AMOXICILLIN 500 MG PO CAPS: 2000.0000 mg | ORAL_CAPSULE | ORAL | 0 refills | Status: AC

## 2022-06-01 NOTE — Telephone Encounter (Signed)
sent 

## 2022-07-20 ENCOUNTER — Other Ambulatory Visit: Payer: Self-pay | Admitting: Family Medicine

## 2022-07-20 DIAGNOSIS — E785 Hyperlipidemia, unspecified: Secondary | ICD-10-CM

## 2022-08-04 ENCOUNTER — Other Ambulatory Visit: Payer: Self-pay | Admitting: Family Medicine

## 2022-08-04 DIAGNOSIS — I10 Essential (primary) hypertension: Secondary | ICD-10-CM

## 2022-08-11 ENCOUNTER — Encounter: Payer: Self-pay | Admitting: Family Medicine

## 2022-08-11 DIAGNOSIS — I10 Essential (primary) hypertension: Secondary | ICD-10-CM

## 2022-08-13 MED ORDER — VALSARTAN-HYDROCHLOROTHIAZIDE 320-12.5 MG PO TABS: 1.0000 | ORAL_TABLET | Freq: Every day | ORAL | 1 refills | Status: DC

## 2022-09-19 NOTE — Progress Notes (Signed)
Office Visit Note   Patient: Gabriel Oconnell           Date of Birth: April 04, 1966           MRN: [id number] Visit Date: 09/21/2022              Requested by: Pearline Cables, MD [address] [place],  [place] [address] PCP: Pearline Cables, MD   Assessment & Plan: Visit Diagnoses:  1. Status post total replacement of left hip   2. Status post total replacement of right hip     Plan: Dental prophylaxis reinforced.  At this point Gabriel Oconnell has made a full recovery from the surgeries and he can follow-up with [place] as needed.  Follow-Up Instructions: Return if symptoms worsen or fail to improve.   Orders:  Orders Placed This Encounter  Procedures   XR Pelvis 1-2 Views   No orders of the defined types were placed in this encounter.     Procedures: No procedures performed   Clinical Data: No additional findings.   Subjective: Chief Complaint  Patient presents with   Right Hip - Follow-up   Left Hip - Follow-up    HPI  Gabriel Oconnell is a little over 2 years status post left total hip replacement and a little under 2 years status post right total hip replacement.  He is doing great has no complaints.  He has been able to hike without any problems.  He is very pleased.  Review of Systems   Objective: Vital Signs: There were no vitals taken for this visit.  Physical Exam  Ortho Exam  Examination of bilateral hips shows fully healed surgical scars.  Fluid is painless range of motion.  Equal leg lengths and good gait pattern.  Specialty Comments:  No specialty comments available.  Imaging: XR Pelvis 1-2 Views  Result Date: 09/21/2022 Stable bilateral total hip replacement without complications    PMFS History: Patient Active Problem List   Diagnosis Date Noted   Primary osteoarthritis of right hip 03/27/2021   Status post total replacement of right hip 03/27/2021   Status post total replacement of left hip 06/13/2020   Chronic left-sided low back  pain with left-sided sciatica 02/05/2020   Prediabetes 01/24/2020   Obesity 07/03/2015   Right foot pain 04/30/2015   Left foot pain 06/27/2014   Vestibular schwannoma 12/06/2012   Ringing in right ear 10/12/2012   Hip pain 07/07/2012   Family hx of colon cancer 09/29/2010   DANDRUFF 08/07/2008   LIPOMA 03/25/2008   Hyperlipidemia 08/24/2006   Essential hypertension 08/24/2006   Past Medical History:  Diagnosis Date   Acoustic neuroma (HCC)    right   Allergy    Hearing loss    right ear -no hearing aids   Hyperlipidemia    Hypertension    PONV (postoperative nausea and vomiting)    Dry Heaves after surgery 2022    Family History  Problem Relation Age of Onset   Heart disease Mother    Pancreatic cancer Father    Colon polyps Father    Colon cancer Father        late 15's   Colon cancer Paternal Grandmother        early 57's   Esophageal cancer Neg Hx    Rectal cancer Neg Hx    Stomach cancer Neg Hx     Past Surgical History:  Procedure Laterality Date   COLONOSCOPY     x2  KNEE SURGERY     left   TOTAL HIP ARTHROPLASTY Left 06/13/2020   Procedure: LEFT TOTAL HIP ARTHROPLASTY ANTERIOR APPROACH;  Surgeon: Tarry Kos, MD;  Location: [facility];  Service: Orthopedics;  Laterality: Left;  3C   TOTAL HIP ARTHROPLASTY Right 03/27/2021   Procedure: RIGHT TOTAL HIP ARTHROPLASTY ANTERIOR APPROACH;  Surgeon: Tarry Kos, MD;  Location: [facility];  Service: Orthopedics;  Laterality: Right;  3-C   WISDOM TOOTH EXTRACTION     Social History   Occupational History   Not on file  Tobacco Use   Smoking status: Former    Types: Cigarettes    Quit date: 10/13/1995    Years since quitting: 26.9   Smokeless tobacco: Never   Tobacco comments:    social smoker - quit 1997  Vaping Use   Vaping Use: Never used  Substance and Sexual Activity   Alcohol use: Yes    Alcohol/week: 2.0 standard drinks of alcohol    Types: 2 Standard drinks or equivalent per week    Comment: 2-4  drinks per week per pt   Drug use: No   Sexual activity: Not on file

## 2022-09-21 ENCOUNTER — Ambulatory Visit (INDEPENDENT_AMBULATORY_CARE_PROVIDER_SITE_OTHER): Payer: BC Managed Care – PPO | Admitting: Orthopaedic Surgery

## 2022-09-21 ENCOUNTER — Other Ambulatory Visit (INDEPENDENT_AMBULATORY_CARE_PROVIDER_SITE_OTHER): Payer: BC Managed Care – PPO

## 2022-09-21 DIAGNOSIS — Z96642 Presence of left artificial hip joint: Secondary | ICD-10-CM | POA: Diagnosis not present

## 2022-09-21 DIAGNOSIS — Z96641 Presence of right artificial hip joint: Secondary | ICD-10-CM

## 2022-10-01 ENCOUNTER — Encounter: Payer: Self-pay | Admitting: Orthopaedic Surgery

## 2022-11-02 ENCOUNTER — Other Ambulatory Visit: Payer: Self-pay | Admitting: Family Medicine

## 2022-11-02 DIAGNOSIS — I10 Essential (primary) hypertension: Secondary | ICD-10-CM

## 2023-03-05 NOTE — Patient Instructions (Signed)
It was great to see again today, I will be in touch with your lab work

## 2023-03-05 NOTE — Progress Notes (Unsigned)
[facility] [address], [address] [place], [place] [address] [phone]  Date:  03/09/2023   Name:  Gabriel Oconnell   DOB:  Nov 11, 1965   MRN:  [id number]  PCP:  Pearline Cables, MD    Chief Complaint: No chief complaint on file.   History of Present Illness:  Gabriel Oconnell is a 57 y.o. very pleasant male patient who presents with the following:  Patient seen today for physical exam Most recent visit with myself was about 1 year ago He has history of hypertension, hyperlipidemia, overweight, prediabetes, vestibular schwannoma, diagnosed due to right-sided tinnitus.  He is status post bilateral hip replacement with excellent results   This is followed by an ENT specialist in [place] he is seen every 2 years and has MRI monitoring.   Married to [employer] grown children.  His daughter is a Engineer, civil (consulting) but is applying for a CRNA program, son recently graduated with a civil Medical laboratory scientific officer- he got a job in [place] as a Sport and exercise psychologist   Flu vaccine COVID booster Colonoscopy is up-to-date Can update labs today  Amlodipine Aspirin 81 Simvastatin Valsartan hydrochlorothiazide  Can offer CT coronary calcium  Former light smoker, quit in the 90s Patient Active Problem List   Diagnosis Date Noted   Primary osteoarthritis of right hip 03/27/2021   Status post total replacement of right hip 03/27/2021   Status post total replacement of left hip 06/13/2020   Chronic left-sided low back pain with left-sided sciatica 02/05/2020   Prediabetes 01/24/2020   Obesity 07/03/2015   Right foot pain 04/30/2015   Left foot pain 06/27/2014   Vestibular schwannoma (HCC) 12/06/2012   Ringing in right ear 10/12/2012   Hip pain 07/07/2012   Family hx of colon cancer 09/29/2010   DANDRUFF 08/07/2008   Lipoma 03/25/2008   Hyperlipidemia 08/24/2006   Essential hypertension 08/24/2006    Past Medical History:  Diagnosis Date   Acoustic  neuroma (HCC)    right   Allergy    Hearing loss    right ear -no hearing aids   Hyperlipidemia    Hypertension    PONV (postoperative nausea and vomiting)    Dry Heaves after surgery 2022    Past Surgical History:  Procedure Laterality Date   COLONOSCOPY     x2   KNEE SURGERY     left   TOTAL HIP ARTHROPLASTY Left 06/13/2020   Procedure: LEFT TOTAL HIP ARTHROPLASTY ANTERIOR APPROACH;  Surgeon: Tarry Kos, MD;  Location: [facility];  Service: Orthopedics;  Laterality: Left;  3C   TOTAL HIP ARTHROPLASTY Right 03/27/2021   Procedure: RIGHT TOTAL HIP ARTHROPLASTY ANTERIOR APPROACH;  Surgeon: Tarry Kos, MD;  Location: [facility];  Service: Orthopedics;  Laterality: Right;  3-C   WISDOM TOOTH EXTRACTION      Social History   Tobacco Use   Smoking status: Former    Current packs/day: 0.00    Types: Cigarettes    Quit date: 10/13/1995    Years since quitting: 27.4   Smokeless tobacco: Never   Tobacco comments:    social smoker - quit 1997  Vaping Use   Vaping status: Never Used  Substance Use Topics   Alcohol use: Yes    Alcohol/week: 2.0 standard drinks of alcohol    Types: 2 Standard drinks or equivalent per week    Comment: 2-4 drinks per week per pt   Drug use: No    Family  History  Problem Relation Age of Onset   Heart disease Mother    Pancreatic cancer Father    Colon polyps Father    Colon cancer Father        late 74's   Colon cancer Paternal Grandmother        early 17's   Esophageal cancer Neg Hx    Rectal cancer Neg Hx    Stomach cancer Neg Hx     No Known Allergies  Medication list has been reviewed and updated.  Current Outpatient Medications on File Prior to Visit  Medication Sig Dispense Refill   acetaminophen (TYLENOL) 500 MG tablet Take 1,000 mg by mouth every 4 (four) hours as needed (pain).     amLODipine (NORVASC) 10 MG tablet TAKE 1 TABLET DAILY 90 tablet 2   amoxicillin (AMOXIL) 500 MG capsule Take 4 capsules (2,000 mg total) by mouth  See admin instructions. Take 4 capsules (2000 mg) by mouth 1 hour prior to dental appointment 12 capsule 0   aspirin EC 81 MG tablet Take 1 tablet (81 mg total) by mouth 2 (two) times daily. To be taken after surgery to prevent blood clots 84 tablet 0   Glucosamine HCl 1500 MG TABS Take 1,500 mg by mouth in the morning.     Multiple Vitamin (MULTIVITAMIN WITH MINERALS) TABS tablet Take 1 tablet by mouth in the morning.     simvastatin (ZOCOR) 20 MG tablet TAKE 1 TABLET DAILY 90 tablet 3   valsartan-hydrochlorothiazide (DIOVAN-HCT) 320-12.5 MG tablet TAKE 1 TABLET DAILY 90 tablet 3   No current facility-administered medications on file prior to visit.    Review of Systems:  As per HPI- otherwise negative.   Physical Examination: There were no vitals filed for this visit. There were no vitals filed for this visit. There is no height or weight on file to calculate BMI. Ideal Body Weight:    GEN: no acute distress. HEENT: Atraumatic, Normocephalic.  Ears and Nose: No external deformity. CV: RRR, No M/G/R. No JVD. No thrill. No extra heart sounds. PULM: CTA B, no wheezes, crackles, rhonchi. No retractions. No resp. distress. No accessory muscle use. ABD: S, NT, ND, +BS. No rebound. No HSM. EXTR: No c/c/e PSYCH: Normally interactive. Conversant.    Assessment and Plan: *** Physical exam today.  Recommended healthy diet and exercise routine Will plan further follow- up pending labs.  Signed Abbe Amsterdam, MD

## 2023-03-09 ENCOUNTER — Encounter: Payer: Self-pay | Admitting: Family Medicine

## 2023-03-09 ENCOUNTER — Ambulatory Visit (INDEPENDENT_AMBULATORY_CARE_PROVIDER_SITE_OTHER): Payer: BC Managed Care – PPO | Admitting: Family Medicine

## 2023-03-09 VITALS — BP 132/80 | HR 75 | Temp 97.9°F | Resp 18 | Ht 72.0 in

## 2023-03-09 DIAGNOSIS — Z125 Encounter for screening for malignant neoplasm of prostate: Secondary | ICD-10-CM

## 2023-03-09 DIAGNOSIS — R972 Elevated prostate specific antigen [PSA]: Secondary | ICD-10-CM

## 2023-03-09 DIAGNOSIS — E785 Hyperlipidemia, unspecified: Secondary | ICD-10-CM

## 2023-03-09 DIAGNOSIS — L309 Dermatitis, unspecified: Secondary | ICD-10-CM

## 2023-03-09 DIAGNOSIS — Z131 Encounter for screening for diabetes mellitus: Secondary | ICD-10-CM

## 2023-03-09 DIAGNOSIS — I10 Essential (primary) hypertension: Secondary | ICD-10-CM | POA: Diagnosis not present

## 2023-03-09 DIAGNOSIS — Z0000 Encounter for general adult medical examination without abnormal findings: Secondary | ICD-10-CM

## 2023-03-09 DIAGNOSIS — Z23 Encounter for immunization: Secondary | ICD-10-CM

## 2023-03-09 DIAGNOSIS — E782 Mixed hyperlipidemia: Secondary | ICD-10-CM | POA: Diagnosis not present

## 2023-03-09 LAB — CBC
HCT: 47 % (ref 39.0–52.0)
Hemoglobin: 15.6 g/dL (ref 13.0–17.0)
MCHC: 33.1 g/dL (ref 30.0–36.0)
MCV: 97.2 fL (ref 78.0–100.0)
Platelets: 284 10*3/uL (ref 150.0–400.0)
RBC: 4.84 Mil/uL (ref 4.22–5.81)
RDW: 12.8 % (ref 11.5–15.5)
WBC: 8.2 10*3/uL (ref 4.0–10.5)

## 2023-03-09 LAB — PSA: PSA: 1.58 ng/mL (ref 0.10–4.00)

## 2023-03-09 LAB — COMPREHENSIVE METABOLIC PANEL
ALT: 19 U/L (ref 0–53)
AST: 17 U/L (ref 0–37)
Albumin: 4.5 g/dL (ref 3.5–5.2)
Alkaline Phosphatase: 79 U/L (ref 39–117)
BUN: 11 mg/dL (ref 6–23)
CO2: 29 meq/L (ref 19–32)
Calcium: 9.7 mg/dL (ref 8.4–10.5)
Chloride: 101 meq/L (ref 96–112)
Creatinine, Ser: 0.82 mg/dL (ref 0.40–1.50)
GFR: 97.43 mL/min (ref >60.00)
Glucose, Bld: 99 mg/dL (ref 70–99)
Potassium: 4 meq/L (ref 3.5–5.1)
Sodium: 140 meq/L (ref 135–145)
Total Bilirubin: 0.9 mg/dL (ref 0.2–1.2)
Total Protein: 6.8 g/dL (ref 6.0–8.3)

## 2023-03-09 LAB — LIPID PANEL
Cholesterol: 168 mg/dL (ref 0–200)
HDL: 47.9 mg/dL (ref >39.00)
LDL Cholesterol: 86 mg/dL (ref 0–99)
NonHDL: 120.11
Total CHOL/HDL Ratio: 4
Triglycerides: 169 mg/dL — ABNORMAL HIGH (ref 0.0–149.0)
VLDL: 33.8 mg/dL (ref 0.0–40.0)

## 2023-03-09 LAB — HEMOGLOBIN A1C: Hgb A1c MFr Bld: 6 % (ref 4.6–6.5)

## 2023-03-09 MED ORDER — AMLODIPINE BESYLATE 10 MG PO TABS
10.0000 mg | ORAL_TABLET | Freq: Every day | ORAL | 3 refills | Status: DC
Start: 2023-03-09 — End: 2024-03-14

## 2023-03-09 MED ORDER — TRIAMCINOLONE ACETONIDE 0.5 % EX OINT
1.0000 | TOPICAL_OINTMENT | Freq: Two times a day (BID) | CUTANEOUS | 1 refills | Status: AC
Start: 2023-03-09 — End: ?

## 2023-03-09 MED ORDER — SIMVASTATIN 20 MG PO TABS
20.0000 mg | ORAL_TABLET | Freq: Every day | ORAL | 3 refills | Status: DC
Start: 2023-03-09 — End: 2024-03-14

## 2023-06-13 ENCOUNTER — Other Ambulatory Visit: Payer: Self-pay | Admitting: *Deleted

## 2023-06-13 DIAGNOSIS — R972 Elevated prostate specific antigen [PSA]: Secondary | ICD-10-CM

## 2023-06-14 ENCOUNTER — Other Ambulatory Visit (INDEPENDENT_AMBULATORY_CARE_PROVIDER_SITE_OTHER): Payer: BC Managed Care – PPO

## 2023-06-14 ENCOUNTER — Encounter: Payer: Self-pay | Admitting: Family Medicine

## 2023-06-14 DIAGNOSIS — R972 Elevated prostate specific antigen [PSA]: Secondary | ICD-10-CM

## 2023-06-14 LAB — PSA: PSA: 2.18 ng/mL (ref 0.10–4.00)

## 2023-06-15 NOTE — Addendum Note (Signed)
 Addended by: Gates Kasal C on: 06/15/2023 10:44 AM   Modules accepted: Orders

## 2023-07-01 ENCOUNTER — Ambulatory Visit (INDEPENDENT_AMBULATORY_CARE_PROVIDER_SITE_OTHER): Payer: BC Managed Care – PPO | Admitting: Urology

## 2023-07-01 ENCOUNTER — Encounter: Payer: Self-pay | Admitting: Urology

## 2023-07-01 VITALS — BP 145/91 | HR 83 | Ht 72.0 in | Wt 240.0 lb

## 2023-07-01 DIAGNOSIS — R972 Elevated prostate specific antigen [PSA]: Secondary | ICD-10-CM | POA: Insufficient documentation

## 2023-07-01 DIAGNOSIS — R351 Nocturia: Secondary | ICD-10-CM | POA: Insufficient documentation

## 2023-07-01 LAB — MICROSCOPIC EXAMINATION
Bacteria, UA: NONE SEEN
Casts: NONE SEEN /[LPF]
Crystals: NONE SEEN
Epithelial Cells (non renal): NONE SEEN /[HPF] (ref 0–10)
WBC, UA: NONE SEEN /[HPF] (ref 0–5)

## 2023-07-01 LAB — URINALYSIS, ROUTINE W REFLEX MICROSCOPIC
Bilirubin, UA: NEGATIVE
Glucose, UA: NEGATIVE
Ketones, UA: NEGATIVE
Leukocytes,UA: NEGATIVE
Nitrite, UA: NEGATIVE
Specific Gravity, UA: 1.025 (ref 1.005–1.030)
Urobilinogen, Ur: 0.2 mg/dL (ref 0.2–1.0)
pH, UA: 6 (ref 5.0–7.5)

## 2023-07-01 NOTE — Progress Notes (Signed)
Assessment: 1. Rising PSA level   2. Nocturia     Plan: I personally reviewed the patient's chart including provider notes, and lab results. His PSA is within the normal range and in comparison to prior levels dating back to 2022 has not changed dramatically. Recommend repeating the PSA in approximately 3-4 months. I discussed possible causes of nocturia and management options.  He would like to continue to monitor the symptoms at the present time. PSA in 3-4 months.  Chief Complaint:  Chief Complaint  Patient presents with    Rising PSA level     History of Present Illness:  Gabriel Oconnell is a 58 y.o. male who is seen in consultation from Copland, Gwenlyn Found, MD for evaluation of rising PSA and nocturia.  PSA results: 4/20 0.99 8/21 1.0 8/22 1.78 4/23 1.30 10/23 1.16 10/24 1.58 1/25 2.18  No history of UTIs or prostatitis.  No family history of prostate cancer. He reports nocturia 2-3 times per night.  No daytime urinary symptoms.  No dysuria or gross hematuria. IPSS = 3 today.  Past Medical History:  Past Medical History:  Diagnosis Date   Acoustic neuroma (HCC)    right   Allergy    Hearing loss    right ear -no hearing aids   Hyperlipidemia    Hypertension    PONV (postoperative nausea and vomiting)    Dry Heaves after surgery 2022    Past Surgical History:  Past Surgical History:  Procedure Laterality Date   COLONOSCOPY     x2   KNEE SURGERY     left   TOTAL HIP ARTHROPLASTY Left 06/13/2020   Procedure: LEFT TOTAL HIP ARTHROPLASTY ANTERIOR APPROACH;  Surgeon: Tarry Kos, MD;  Location: [facility];  Service: Orthopedics;  Laterality: Left;  3C   TOTAL HIP ARTHROPLASTY Right 03/27/2021   Procedure: RIGHT TOTAL HIP ARTHROPLASTY ANTERIOR APPROACH;  Surgeon: Tarry Kos, MD;  Location: [facility];  Service: Orthopedics;  Laterality: Right;  3-C   WISDOM TOOTH EXTRACTION      Allergies:  No Known Allergies  Family History:  Family History  Problem  Relation Age of Onset   Heart disease Mother    Pancreatic cancer Father    Colon polyps Father    Colon cancer Father        late 59's   Colon cancer Paternal Grandmother        early 21's   Esophageal cancer Neg Hx    Rectal cancer Neg Hx    Stomach cancer Neg Hx     Social History:  Social History   Tobacco Use   Smoking status: Former    Current packs/day: 0.00    Types: Cigarettes    Quit date: 10/13/1995    Years since quitting: 27.7   Smokeless tobacco: Never   Tobacco comments:    social smoker - quit 1997  Vaping Use   Vaping status: Never Used  Substance Use Topics   Alcohol use: Yes    Alcohol/week: 2.0 standard drinks of alcohol    Types: 2 Standard drinks or equivalent per week    Comment: 2-4 drinks per week per pt   Drug use: No    Review of symptoms:  Constitutional:  Negative for unexplained weight loss, night sweats, fever, chills ENT:  Negative for nose bleeds, sinus pain, painful swallowing CV:  Negative for chest pain, shortness of breath, exercise intolerance, palpitations, loss of consciousness Resp:  Negative for cough, wheezing, shortness  of breath GI:  Negative for nausea, vomiting, diarrhea, bloody stools GU:  Positives noted in HPI; otherwise negative for gross hematuria, dysuria, urinary incontinence Neuro:  Negative for seizures, poor balance, limb weakness, slurred speech Psych:  Negative for lack of energy, depression, anxiety Endocrine:  Negative for polydipsia, polyuria, symptoms of hypoglycemia (dizziness, hunger, sweating) Hematologic:  Negative for anemia, purpura, petechia, prolonged or excessive bleeding, use of anticoagulants  Allergic:  Negative for difficulty breathing or choking as a result of exposure to anything; no shellfish allergy; no allergic response (rash/itch) to materials, foods  Physical exam: BP (!) 145/91   Pulse 83   Ht 6' (1.829 m)   Wt 240 lb (108.9 kg)   BMI 32.55 kg/m  GENERAL APPEARANCE:  Well  appearing, well developed, well nourished, NAD HEENT: Atraumatic, Normocephalic, oropharynx clear. NECK: Supple without lymphadenopathy or thyromegaly. LUNGS: Clear to auscultation bilaterally. HEART: Regular Rate and Rhythm without murmurs, gallops, or rubs. ABDOMEN: Soft, non-tender, No Masses. EXTREMITIES: Moves all extremities well.  Without clubbing, cyanosis, or edema. NEUROLOGIC:  Alert and oriented x 3, normal gait, CN II-XII grossly intact.  MENTAL STATUS:  Appropriate. BACK:  Non-tender to palpation.  No CVAT SKIN:  Warm, dry and intact.   GU: Prostate: 40 g, NT, no nodules Rectum: Normal tone,  no masses or tenderness  Results: U/A: 0 WBC, 0-2 RBC

## 2023-07-11 DIAGNOSIS — D2239 Melanocytic nevi of other parts of face: Secondary | ICD-10-CM | POA: Diagnosis not present

## 2023-07-11 DIAGNOSIS — D485 Neoplasm of uncertain behavior of skin: Secondary | ICD-10-CM | POA: Diagnosis not present

## 2023-07-11 DIAGNOSIS — L280 Lichen simplex chronicus: Secondary | ICD-10-CM | POA: Diagnosis not present

## 2023-10-28 ENCOUNTER — Other Ambulatory Visit: Payer: Self-pay | Admitting: Family Medicine

## 2023-10-28 DIAGNOSIS — I10 Essential (primary) hypertension: Secondary | ICD-10-CM

## 2023-11-08 ENCOUNTER — Other Ambulatory Visit: Payer: BC Managed Care – PPO

## 2023-11-08 DIAGNOSIS — R972 Elevated prostate specific antigen [PSA]: Secondary | ICD-10-CM | POA: Diagnosis not present

## 2023-11-09 ENCOUNTER — Ambulatory Visit: Payer: Self-pay | Admitting: Urology

## 2023-11-09 LAB — PSA: Prostate Specific Ag, Serum: 1.8 ng/mL (ref 0.0–4.0)

## 2024-02-24 ENCOUNTER — Encounter: Payer: Self-pay | Admitting: Family Medicine

## 2024-03-08 NOTE — Patient Instructions (Addendum)
 It was great to see you today, I will be in touch with your labs Let's plan to check your PSA in about 6 months

## 2024-03-08 NOTE — Progress Notes (Signed)
  [facility] [address], [address] [place], [place] [address] [phone]  Date:  03/14/2024   Name:  Gabriel Oconnell   DOB:  11-Jul-1965   MRN:  [id number]  PCP:  Watt Harlene BROCKS, MD    Chief Complaint: Annual Exam (Pt states I'm trying a variety of allergy medications to find the one that works best, however I have not found anything that works )   History of Present Illness:  Gabriel Oconnell is a 58 y.o. very pleasant male patient who presents with the following:  Patient seen today for physical exam.  I saw him most recently about a year ago He has history of hypertension, hyperlipidemia, overweight, prediabetes, vestibular schwannoma, diagnosed due to right-sided tinnitus.  He is status post bilateral hip replacement with excellent results  His vestibular schwannoma is followed by ENT in [place], he is seen about every 2 years  He has 2 young adult children, married to [place]  Flu shot- done  Recommend COVID booster- done  Shingles is complete Colonoscopy 2023, he repeats every 5 years Labs look good last year except PSA had risen somewhat and had gone up further on recheck-I referred him for a urology consultation and he saw Dr. Roseann in January Upon a repeat check this past June his PSA had improved Lab Results  Component Value Date   PSA1 1.8 11/08/2023   PSA 1.76 03/14/2024   PSA 2.18 06/14/2023   PSA 1.58 03/09/2023   Amlodipine  5 Aspirin  81 Simvastatin  Valsartan /hydrochlorothiazide   BP Readings from Last 3 Encounters:  03/14/24 112/76  07/01/23 (!) 145/91  03/09/23 132/80   Wt Readings from Last 3 Encounters:  03/14/24 256 lb 3.2 oz (116.2 kg)  07/01/23 240 lb (108.9 kg)  03/03/22 247 lb (112 kg)     Discussed the use of AI scribe software for clinical note transcription with the patient, who gave verbal consent to proceed.  History of Present Illness Gabriel Oconnell is a 58 year old  male who presents with worsening nasal congestion due to allergies.  Over the past year, his allergies have worsened, and his usual medication, Aeroflow (Flonase), has stopped being effective. He has tried different over-the-counter nasal sprays without much success. His primary symptom is nasal congestion, with a sensation of swelling in the nasal cavity. No sneezing or significant mucus production is present.  He has a history of severe allergies during childhood, which had resolved until a couple of years ago when he started using Aeroflow. The allergies have been persistent since spring and have not improved. He has not tried using oral antihistamines like Claritin, Zyrtec, or Allegra in conjunction with the nasal spray.  The skin around his nose has become dry and irritated, and he uses moisturizer to alleviate this.  In terms of social history, he enjoys hiking and recently took a seven-week RV trip to the [facility], where he engaged in hiking activities without any issues such as chest pain. He quit smoking thirty years ago. His son lives in [place], and his daughter is living with him while trying to get into C[employer] school.  Patient Active Problem List   Diagnosis Date Noted   Rising PSA level 07/01/2023   Nocturia 07/01/2023   Primary osteoarthritis of right hip 03/27/2021   Status post total replacement of right hip 03/27/2021   Status post total replacement of left hip 06/13/2020   Chronic left-sided low back pain with left-sided sciatica  02/05/2020   Prediabetes 01/24/2020   Obesity 07/03/2015   Right foot pain 04/30/2015   Left foot pain 06/27/2014   Vestibular schwannoma (HCC) 12/06/2012   Ringing in right ear 10/12/2012   Hip pain 07/07/2012   Family hx of colon cancer 09/29/2010   DANDRUFF 08/07/2008   Lipoma 03/25/2008   Hyperlipidemia 08/24/2006   Essential hypertension 08/24/2006    Past Medical History:  Diagnosis Date   Acoustic neuroma (HCC)    right    Allergy    Hearing loss    right ear -no hearing aids   Hyperlipidemia    Hypertension    PONV (postoperative nausea and vomiting)    Dry Heaves after surgery 2022    Past Surgical History:  Procedure Laterality Date   COLONOSCOPY     x2   KNEE SURGERY     left   TOTAL HIP ARTHROPLASTY Left 06/13/2020   Procedure: LEFT TOTAL HIP ARTHROPLASTY ANTERIOR APPROACH;  Surgeon: Jerri Kay HERO, MD;  Location: [facility];  Service: Orthopedics;  Laterality: Left;  3C   TOTAL HIP ARTHROPLASTY Right 03/27/2021   Procedure: RIGHT TOTAL HIP ARTHROPLASTY ANTERIOR APPROACH;  Surgeon: Jerri Kay HERO, MD;  Location: [facility];  Service: Orthopedics;  Laterality: Right;  3-C   WISDOM TOOTH EXTRACTION      Social History   Tobacco Use   Smoking status: Former    Current packs/day: 0.00    Types: Cigarettes    Quit date: 10/13/1995    Years since quitting: 28.4   Smokeless tobacco: Never   Tobacco comments:    social smoker - quit 1997  Vaping Use   Vaping status: Never Used  Substance Use Topics   Alcohol  use: Yes    Alcohol /week: 2.0 standard drinks of alcohol     Types: 2 Standard drinks or equivalent per week    Comment: 2-4 drinks per week per pt   Drug use: No    Family History  Problem Relation Age of Onset   Heart disease Mother    Pancreatic cancer Father    Colon polyps Father    Colon cancer Father        late 19's   Colon cancer Paternal Grandmother        early 34's   Esophageal cancer Neg Hx    Rectal cancer Neg Hx    Stomach cancer Neg Hx     No Known Allergies  Medication list has been reviewed and updated.  Current Outpatient Medications on File Prior to Visit  Medication Sig Dispense Refill   acetaminophen  (TYLENOL ) 500 MG tablet Take 1,000 mg by mouth every 4 (four) hours as needed (pain).     aspirin  EC 81 MG tablet Take 1 tablet (81 mg total) by mouth 2 (two) times daily. To be taken after surgery to prevent blood clots 84 tablet 0   Glucosamine HCl 1500 MG TABS  Take 1,500 mg by mouth in the morning.     Multiple Vitamin (MULTIVITAMIN WITH MINERALS) TABS tablet Take 1 tablet by mouth in the morning.     amoxicillin  (AMOXIL ) 500 MG capsule Take 4 capsules (2,000 mg total) by mouth See admin instructions. Take 4 capsules (2000 mg) by mouth 1 hour prior to dental appointment (Patient not taking: Reported on 03/14/2024) 12 capsule 0   triamcinolone  ointment (KENALOG ) 0.5 % Apply 1 Application topically 2 (two) times daily. Use as needed for rash 30 g 1   No current facility-administered medications on file prior  to visit.    Review of Systems:  As per HPI- otherwise negative.   Physical Examination: Vitals:   03/14/24 0818  BP: 112/76  Pulse: 62  Temp: 98 F (36.7 C)  SpO2: 97%   Vitals:   03/14/24 0818  Weight: 256 lb 3.2 oz (116.2 kg)  Height: 6' (1.829 m)   Body mass index is 34.75 kg/m. Ideal Body Weight: Weight in (lb) to have BMI = 25: 183.9  GEN: no acute distress.  Mildly obese, looks well HEENT: Atraumatic, Normocephalic.  Bilateral TM wnl, oropharynx normal.  PEERL,EOMI.   Ears and Nose: No external deformity. CV: RRR, No M/G/R. No JVD. No thrill. No extra heart sounds. PULM: CTA B, no wheezes, crackles, rhonchi. No retractions. No resp. distress. No accessory muscle use. ABD: S, NT, ND, +BS. No rebound. No HSM. EXTR: No c/c/e PSYCH: Normally interactive. Conversant.    Assessment and Plan: Physical exam  Essential hypertension - Plan: CBC, Comprehensive metabolic panel with GFR, amLODipine  (NORVASC ) 10 MG tablet, valsartan -hydrochlorothiazide  (DIOVAN -HCT) 320-12.5 MG tablet  Screening for diabetes mellitus - Plan: Comprehensive metabolic panel with GFR, Hemoglobin A1c  Mixed hyperlipidemia - Plan: Lipid panel  Screening for prostate cancer  Hyperlipidemia - Plan: simvastatin  (ZOCOR ) 20 MG tablet  Rising PSA level - Plan: PSA  Assessment & Plan Adult Wellness Visit Routine wellness visit with slightly low blood  pressure, no symptoms of lightheadedness or dizziness, and no issues with chest or hip pain during physical activity. - Perform blood counts, metabolic profile, A1c, and cholesterol tests. - Continue current medications and monitor blood pressure. Adjust medications if blood pressure remains low or symptoms develop. - Encourage continued physical activity, such as hiking.  Essential hypertension Blood pressure is slightly low, but he reports feeling well without dizziness or lightheadedness. - Continue current antihypertensive regimen and monitor blood pressure. - Reassess medication regimen if blood pressure remains low or symptoms develop.  Mixed hyperlipidemia - Perform cholesterol test as part of routine blood work.  Allergic rhinitis Chronic nasal congestion with sensation of nasal cavity swelling. Aeroflow (Flonase) less effective, no current use of oral antihistamines. Nasal skin irritation likely due to medication use. - Recommend adding an oral antihistamine such as Claritin, Zyrtec, or Allegra in addition to the nasal steroid spray. - Consider allergy testing and potential immunotherapy if symptoms persist and significantly impact quality of life. - Advise use of moisturizer for nasal skin irritation.  Signed Harlene Schroeder, MD  Received labs as below, message to patient Results for orders placed or performed in visit on 03/14/24  CBC   Collection Time: 03/14/24  8:40 AM  Result Value Ref Range   WBC 7.7 4.0 - 10.5 K/uL   RBC 4.55 4.22 - 5.81 Mil/uL   Platelets 266.0 150.0 - 400.0 K/uL   Hemoglobin 15.0 13.0 - 17.0 g/dL   HCT 56.6 60.9 - 47.9 %   MCV 95.3 78.0 - 100.0 fl   MCHC 34.6 30.0 - 36.0 g/dL   RDW 87.2 88.4 - 84.4 %  Comprehensive metabolic panel with GFR   Collection Time: 03/14/24  8:40 AM  Result Value Ref Range   Sodium 139 135 - 145 mEq/L   Potassium 4.0 3.5 - 5.1 mEq/L   Chloride 103 96 - 112 mEq/L   CO2 28 19 - 32 mEq/L   Glucose, Bld 99 70 - 99  mg/dL   BUN 10 6 - 23 mg/dL   Creatinine, Ser 9.08 0.40 - 1.50 mg/dL   Total Bilirubin  0.7 0.2 - 1.2 mg/dL   Alkaline Phosphatase 71 39 - 117 U/L   AST 19 0 - 37 U/L   ALT 21 0 - 53 U/L   Total Protein 7.3 6.0 - 8.3 g/dL   Albumin  4.8 3.5 - 5.2 g/dL   GFR 07.18 >39.99 mL/min   Calcium 9.3 8.4 - 10.5 mg/dL  Hemoglobin J8r   Collection Time: 03/14/24  8:40 AM  Result Value Ref Range   Hgb A1c MFr Bld 6.1 4.6 - 6.5 %  Lipid panel   Collection Time: 03/14/24  8:40 AM  Result Value Ref Range   Cholesterol 157 0 - 200 mg/dL   Triglycerides 870.9 0.0 - 149.0 mg/dL   HDL 54.89 >60.99 mg/dL   VLDL 74.1 0.0 - 59.9 mg/dL   LDL Cholesterol 86 0 - 99 mg/dL   Total CHOL/HDL Ratio 3    NonHDL 111.90   PSA   Collection Time: 03/14/24  8:40 AM  Result Value Ref Range   PSA 1.76 0.10 - 4.00 ng/mL

## 2024-03-14 ENCOUNTER — Encounter: Payer: Self-pay | Admitting: Family Medicine

## 2024-03-14 ENCOUNTER — Ambulatory Visit: Payer: BC Managed Care – PPO | Admitting: Family Medicine

## 2024-03-14 VITALS — BP 112/76 | HR 62 | Temp 98.0°F | Ht 72.0 in | Wt 256.2 lb

## 2024-03-14 DIAGNOSIS — I10 Essential (primary) hypertension: Secondary | ICD-10-CM | POA: Diagnosis not present

## 2024-03-14 DIAGNOSIS — E782 Mixed hyperlipidemia: Secondary | ICD-10-CM

## 2024-03-14 DIAGNOSIS — R972 Elevated prostate specific antigen [PSA]: Secondary | ICD-10-CM | POA: Diagnosis not present

## 2024-03-14 DIAGNOSIS — Z131 Encounter for screening for diabetes mellitus: Secondary | ICD-10-CM

## 2024-03-14 DIAGNOSIS — E785 Hyperlipidemia, unspecified: Secondary | ICD-10-CM

## 2024-03-14 DIAGNOSIS — Z0000 Encounter for general adult medical examination without abnormal findings: Secondary | ICD-10-CM | POA: Diagnosis not present

## 2024-03-14 DIAGNOSIS — Z125 Encounter for screening for malignant neoplasm of prostate: Secondary | ICD-10-CM

## 2024-03-14 LAB — LIPID PANEL
Cholesterol: 157 mg/dL (ref 0–200)
HDL: 45.1 mg/dL (ref >39.00)
LDL Cholesterol: 86 mg/dL (ref 0–99)
NonHDL: 111.9
Total CHOL/HDL Ratio: 3
Triglycerides: 129 mg/dL (ref 0.0–149.0)
VLDL: 25.8 mg/dL (ref 0.0–40.0)

## 2024-03-14 LAB — COMPREHENSIVE METABOLIC PANEL WITH GFR
ALT: 21 U/L (ref 0–53)
AST: 19 U/L (ref 0–37)
Albumin: 4.8 g/dL (ref 3.5–5.2)
Alkaline Phosphatase: 71 U/L (ref 39–117)
BUN: 10 mg/dL (ref 6–23)
CO2: 28 meq/L (ref 19–32)
Calcium: 9.3 mg/dL (ref 8.4–10.5)
Chloride: 103 meq/L (ref 96–112)
Creatinine, Ser: 0.91 mg/dL (ref 0.40–1.50)
GFR: 92.81 mL/min (ref >60.00)
Glucose, Bld: 99 mg/dL (ref 70–99)
Potassium: 4 meq/L (ref 3.5–5.1)
Sodium: 139 meq/L (ref 135–145)
Total Bilirubin: 0.7 mg/dL (ref 0.2–1.2)
Total Protein: 7.3 g/dL (ref 6.0–8.3)

## 2024-03-14 LAB — PSA: PSA: 1.76 ng/mL (ref 0.10–4.00)

## 2024-03-14 LAB — HEMOGLOBIN A1C: Hgb A1c MFr Bld: 6.1 % (ref 4.6–6.5)

## 2024-03-14 LAB — CBC
HCT: 43.3 % (ref 39.0–52.0)
Hemoglobin: 15 g/dL (ref 13.0–17.0)
MCHC: 34.6 g/dL (ref 30.0–36.0)
MCV: 95.3 fl (ref 78.0–100.0)
Platelets: 266 K/uL (ref 150.0–400.0)
RBC: 4.55 Mil/uL (ref 4.22–5.81)
RDW: 12.7 % (ref 11.5–15.5)
WBC: 7.7 K/uL (ref 4.0–10.5)

## 2024-03-14 MED ORDER — VALSARTAN-HYDROCHLOROTHIAZIDE 320-12.5 MG PO TABS: 1.0000 | ORAL_TABLET | Freq: Every day | ORAL | 3 refills | Status: AC

## 2024-03-14 MED ORDER — SIMVASTATIN 20 MG PO TABS: 20.0000 mg | ORAL_TABLET | Freq: Every day | ORAL | 3 refills | Status: AC

## 2024-03-14 MED ORDER — AMLODIPINE BESYLATE 10 MG PO TABS: 10.0000 mg | ORAL_TABLET | Freq: Every day | ORAL | 3 refills | Status: AC

## 2024-05-23 ENCOUNTER — Encounter: Payer: Self-pay | Admitting: Family Medicine

## 2025-03-20 ENCOUNTER — Encounter: Admitting: Family Medicine
